# Patient Record
Sex: Male | Born: 1965 | Race: White | Hispanic: No | Marital: Married | Smoking: Never smoker
Health system: Southern US, Community
[De-identification: ages and names within clinical notes are randomized; demographics above are authoritative.]

## PROBLEM LIST (undated history)

## (undated) DIAGNOSIS — M109 Gout, unspecified: Secondary | ICD-10-CM

## (undated) DIAGNOSIS — I10 Essential (primary) hypertension: Secondary | ICD-10-CM

## (undated) DIAGNOSIS — E119 Type 2 diabetes mellitus without complications: Secondary | ICD-10-CM

## (undated) DIAGNOSIS — E785 Hyperlipidemia, unspecified: Secondary | ICD-10-CM

## (undated) HISTORY — PX: TONSILLECTOMY: SUR1361

## (undated) HISTORY — PX: APPENDECTOMY: SHX54

---

## 2007-01-15 ENCOUNTER — Emergency Department: Payer: Self-pay | Admitting: Emergency Medicine

## 2007-03-21 ENCOUNTER — Ambulatory Visit: Payer: Self-pay | Admitting: Urology

## 2009-11-12 ENCOUNTER — Ambulatory Visit: Payer: Self-pay | Admitting: General Practice

## 2011-08-19 ENCOUNTER — Ambulatory Visit: Payer: Self-pay | Admitting: General Practice

## 2011-08-28 ENCOUNTER — Ambulatory Visit: Payer: Self-pay | Admitting: General Practice

## 2011-09-21 ENCOUNTER — Ambulatory Visit: Payer: Self-pay | Admitting: General Practice

## 2011-11-09 ENCOUNTER — Emergency Department: Payer: Self-pay | Admitting: *Deleted

## 2011-11-09 LAB — COMPREHENSIVE METABOLIC PANEL
Albumin: 3.5 g/dL (ref 3.4–5.0)
Alkaline Phosphatase: 55 U/L (ref 50–136)
Anion Gap: 8 (ref 7–16)
BUN: 20 mg/dL — ABNORMAL HIGH (ref 7–18)
Bilirubin,Total: 0.3 mg/dL (ref 0.2–1.0)
Calcium, Total: 8.3 mg/dL — ABNORMAL LOW (ref 8.5–10.1)
Co2: 24 mmol/L (ref 21–32)
Creatinine: 1.32 mg/dL — ABNORMAL HIGH (ref 0.60–1.30)
EGFR (Non-African Amer.): >60
Potassium: 4.2 mmol/L (ref 3.5–5.1)
SGOT(AST): 26 U/L (ref 15–37)
Sodium: 139 mmol/L (ref 136–145)

## 2011-11-09 LAB — URINALYSIS, COMPLETE
Bacteria: NONE SEEN
Bilirubin,UR: NEGATIVE
Glucose,UR: NEGATIVE mg/dL (ref 0–75)
Ketone: NEGATIVE
Leukocyte Esterase: NEGATIVE
Nitrite: NEGATIVE
Ph: 5 (ref 4.5–8.0)
Squamous Epithelial: NONE SEEN

## 2011-11-09 LAB — CBC WITH DIFFERENTIAL/PLATELET
Basophil #: 0 10*3/uL (ref 0.0–0.1)
Eosinophil #: 0.1 10*3/uL (ref 0.0–0.7)
HGB: 14.9 g/dL (ref 13.0–18.0)
MCH: 29 pg (ref 26.0–34.0)
MCHC: 32.5 g/dL (ref 32.0–36.0)
MCV: 89 fL (ref 80–100)
Monocyte %: 7.8 %
Neutrophil #: 5 10*3/uL (ref 1.4–6.5)
Neutrophil %: 68.6 %
RDW: 14.3 % (ref 11.5–14.5)

## 2012-04-16 ENCOUNTER — Emergency Department: Payer: Self-pay | Admitting: Emergency Medicine

## 2012-04-16 LAB — CBC
MCH: 29.3 pg (ref 26.0–34.0)
MCHC: 32.9 g/dL (ref 32.0–36.0)
MCV: 89 fL (ref 80–100)
RBC: 5.34 10*6/uL (ref 4.40–5.90)
WBC: 11.9 10*3/uL — ABNORMAL HIGH (ref 3.8–10.6)

## 2012-04-16 LAB — URINALYSIS, COMPLETE
Bilirubin,UR: NEGATIVE
Ketone: NEGATIVE
Leukocyte Esterase: NEGATIVE
Ph: 5 (ref 4.5–8.0)
Protein: NEGATIVE
RBC,UR: 17 /HPF (ref 0–5)
Specific Gravity: 1.021 (ref 1.003–1.030)
Squamous Epithelial: NONE SEEN

## 2012-04-16 LAB — COMPREHENSIVE METABOLIC PANEL
Albumin: 3.8 g/dL (ref 3.4–5.0)
Alkaline Phosphatase: 63 U/L (ref 50–136)
BUN: 16 mg/dL (ref 7–18)
Bilirubin,Total: 0.3 mg/dL (ref 0.2–1.0)
Calcium, Total: 9 mg/dL (ref 8.5–10.1)
Creatinine: 1.31 mg/dL — ABNORMAL HIGH (ref 0.60–1.30)
EGFR (Non-African Amer.): >60
Glucose: 121 mg/dL — ABNORMAL HIGH (ref 65–99)
Potassium: 4.1 mmol/L (ref 3.5–5.1)
SGOT(AST): 29 U/L (ref 15–37)
SGPT (ALT): 56 U/L (ref 12–78)

## 2014-02-05 ENCOUNTER — Ambulatory Visit: Payer: Self-pay | Admitting: General Practice

## 2016-03-11 ENCOUNTER — Other Ambulatory Visit: Payer: Self-pay | Admitting: Physician Assistant

## 2016-03-13 ENCOUNTER — Other Ambulatory Visit: Payer: Self-pay | Admitting: Physician Assistant

## 2017-09-30 ENCOUNTER — Other Ambulatory Visit: Payer: Self-pay | Admitting: Family Medicine

## 2017-09-30 ENCOUNTER — Ambulatory Visit
Admission: RE | Admit: 2017-09-30 | Discharge: 2017-09-30 | Disposition: A | Discharge status: Home / Self Care | Payer: BLUE CROSS/BLUE SHIELD | Source: Ambulatory Visit | Attending: Family Medicine | Admitting: Family Medicine

## 2017-09-30 DIAGNOSIS — M79605 Pain in left leg: Secondary | ICD-10-CM | POA: Diagnosis present

## 2017-10-06 ENCOUNTER — Ambulatory Visit: Payer: BLUE CROSS/BLUE SHIELD | Attending: Otolaryngology

## 2017-10-06 DIAGNOSIS — F5101 Primary insomnia: Secondary | ICD-10-CM | POA: Insufficient documentation

## 2017-10-06 DIAGNOSIS — G4733 Obstructive sleep apnea (adult) (pediatric): Secondary | ICD-10-CM | POA: Insufficient documentation

## 2017-12-12 ENCOUNTER — Emergency Department
Admission: EM | Admit: 2017-12-12 | Discharge: 2017-12-12 | Disposition: A | Discharge status: Home / Self Care | Payer: BLUE CROSS/BLUE SHIELD | Attending: Emergency Medicine | Admitting: Emergency Medicine

## 2017-12-12 DIAGNOSIS — R1013 Epigastric pain: Secondary | ICD-10-CM | POA: Diagnosis present

## 2017-12-12 DIAGNOSIS — R112 Nausea with vomiting, unspecified: Secondary | ICD-10-CM | POA: Insufficient documentation

## 2017-12-12 DIAGNOSIS — I10 Essential (primary) hypertension: Secondary | ICD-10-CM | POA: Insufficient documentation

## 2017-12-12 DIAGNOSIS — E119 Type 2 diabetes mellitus without complications: Secondary | ICD-10-CM | POA: Diagnosis not present

## 2017-12-12 HISTORY — DX: Essential (primary) hypertension: I10

## 2017-12-12 HISTORY — DX: Gout, unspecified: M10.9

## 2017-12-12 HISTORY — DX: Hyperlipidemia, unspecified: E78.5

## 2017-12-12 HISTORY — DX: Type 2 diabetes mellitus without complications: E11.9

## 2017-12-12 LAB — URINALYSIS, COMPLETE (UACMP) WITH MICROSCOPIC
BILIRUBIN URINE: NEGATIVE
Bacteria, UA: NONE SEEN
GLUCOSE, UA: NEGATIVE mg/dL
HGB URINE DIPSTICK: NEGATIVE
Ketones, ur: NEGATIVE mg/dL
LEUKOCYTES UA: NEGATIVE
NITRITE: NEGATIVE
PH: 5 (ref 5.0–8.0)
Protein, ur: NEGATIVE mg/dL
SPECIFIC GRAVITY, URINE: 1.02 (ref 1.005–1.030)
Squamous Epithelial / LPF: NONE SEEN (ref 0–5)

## 2017-12-12 LAB — COMPREHENSIVE METABOLIC PANEL
ALBUMIN: 3.9 g/dL (ref 3.5–5.0)
ALT: 32 U/L (ref 17–63)
ANION GAP: 11 (ref 5–15)
AST: 30 U/L (ref 15–41)
Alkaline Phosphatase: 47 U/L (ref 38–126)
BILIRUBIN TOTAL: 0.8 mg/dL (ref 0.3–1.2)
BUN: 18 mg/dL (ref 6–20)
CO2: 21 mmol/L — AB (ref 22–32)
Calcium: 8.7 mg/dL — ABNORMAL LOW (ref 8.9–10.3)
Chloride: 105 mmol/L (ref 101–111)
Creatinine, Ser: 0.93 mg/dL (ref 0.61–1.24)
GFR calc Af Amer: >60 mL/min (ref >60)
GFR calc non Af Amer: >60 mL/min (ref >60)
GLUCOSE: 152 mg/dL — AB (ref 65–99)
POTASSIUM: 3.9 mmol/L (ref 3.5–5.1)
SODIUM: 137 mmol/L (ref 135–145)
TOTAL PROTEIN: 7.4 g/dL (ref 6.5–8.1)

## 2017-12-12 LAB — CBC
HCT: 47 % (ref 40.0–52.0)
HEMOGLOBIN: 16 g/dL (ref 13.0–18.0)
MCH: 30.1 pg (ref 26.0–34.0)
MCHC: 34.2 g/dL (ref 32.0–36.0)
MCV: 88.2 fL (ref 80.0–100.0)
Platelets: 198 10*3/uL (ref 150–440)
RBC: 5.32 MIL/uL (ref 4.40–5.90)
RDW: 14.1 % (ref 11.5–14.5)
WBC: 9.2 10*3/uL (ref 3.8–10.6)

## 2017-12-12 LAB — TROPONIN I

## 2017-12-12 LAB — LIPASE, BLOOD: Lipase: 26 U/L (ref 11–51)

## 2017-12-12 MED ORDER — ONDANSETRON 4 MG PO TBDP
ORAL_TABLET | ORAL | Status: AC
  Administered 2017-12-12: 4 mg via ORAL
  Filled 2017-12-12: qty 1

## 2017-12-12 MED ORDER — HYDROCODONE-ACETAMINOPHEN 5-325 MG PO TABS: 1.0000 | ORAL_TABLET | ORAL | 0 refills | Status: DC | PRN

## 2017-12-12 MED ORDER — HYDROCODONE-ACETAMINOPHEN 5-325 MG PO TABS
ORAL_TABLET | ORAL | Status: AC
  Administered 2017-12-12: 2 via ORAL
  Filled 2017-12-12: qty 2

## 2017-12-12 MED ORDER — HYDROCODONE-ACETAMINOPHEN 5-325 MG PO TABS
2.0000 | ORAL_TABLET | Freq: Once | ORAL | Status: AC
  Administered 2017-12-12: 2 via ORAL

## 2017-12-12 MED ORDER — ONDANSETRON 4 MG PO TBDP
4.0000 mg | ORAL_TABLET | Freq: Once | ORAL | Status: AC
  Administered 2017-12-12: 4 mg via ORAL

## 2017-12-12 MED ORDER — ONDANSETRON 4 MG PO TBDP: 4.0000 mg | ORAL_TABLET | Freq: Three times a day (TID) | ORAL | 0 refills | Status: DC | PRN

## 2017-12-12 NOTE — ED Triage Notes (Signed)
Pt reports having stressful night last night and then woke up this morning with epigastric pain and vomiting.  Pt is A&Ox4, in NAD.  Wife had stomach bug last Saturday.  Pt also had jaw pain.

## 2017-12-12 NOTE — ED Provider Notes (Addendum)
[facility] Emergency Department Provider Note  Time seen: 5:30 PM  I have reviewed the triage vital signs and the nursing notes.   HISTORY  Chief Complaint Abdominal Pain    HPI Ricardo Nelson is a 52 y.o. male with a past medical history of diabetes, hypertension, hyperlipidemia presents to the emergency department for mild upper abdominal discomfort and low-grade fever.  According to the patient since last night he has been experiencing some mild upper abdominal discomfort, subjective fever.  This morning had nausea and vomiting, denies any diarrhea.  States he was feeling some tightness into the chest and jaw this morning as well but states they had to put their dog down yesterday, he feels a lot of this is just anxiety and stress.  Wife states she had a GI bug earlier this week with fatigue nausea vomiting.   Past Medical History:  Diagnosis Date  . Diabetes mellitus without complication (HCC)   . Gout   . Hyperlipemia   . Hypertension     There are no active problems to display for this patient.   Past Surgical History:  Procedure Laterality Date  . APPENDECTOMY    . TONSILLECTOMY      Prior to Admission medications   Not on File    No Known Allergies  No family history on file.  Social History Social History   Tobacco Use  . Smoking status: Never Smoker  . Smokeless tobacco: Never Used  Substance Use Topics  . Alcohol use: Yes    Comment: 2 drinks a week  . Drug use: Not Currently    Review of Systems Constitutional: Low-grade fever ENT: Negative for recent illness/congestion Cardiovascular: Mild chest tightness Respiratory: Negative for shortness of breath.  Negative for cough. Gastrointestinal: Mild upper abdominal discomfort, positive for nausea vomiting.  Negative for diarrhea. Genitourinary: Negative for urinary compaints Skin: Negative for skin complaints  Neurological: Negative for headache All other ROS  negative  ____________________________________________   PHYSICAL EXAM:  VITAL SIGNS: ED Triage Vitals  Enc Vitals Group     BP 12/12/17 1605 (!) 158/93     Pulse Rate 12/12/17 1605 (!) 111     Resp 12/12/17 1605 (!) 22     Temp 12/12/17 1605 (!) 100.4 F (38 C)     Temp Source 12/12/17 1605 Oral     SpO2 12/12/17 1605 96 %     Weight 12/12/17 1606 (!) 320 lb (145.2 kg)     Height --      Head Circumference --      Peak Flow --      Pain Score 12/12/17 1605 6     Pain Loc --      Pain Edu? --      Excl. in GC? --    Constitutional: Alert and oriented. Well appearing and in no distress. Eyes: Normal exam ENT   Head: Normocephalic and atraumatic.   Mouth/Throat: Mucous membranes are moist. Cardiovascular: Regular rhythm, rate around 100 bpm.  No murmur. Respiratory: Normal respiratory effort without tachypnea nor retractions. Breath sounds are clear, no wheeze rales or rhonchi. Gastrointestinal: Obese, but soft very slight epigastric tenderness otherwise benign abdomen.  No rebound or guarding.  Deep palpation across the lower abdomen and right upper quadrant with no tenderness elicited. Musculoskeletal: Nontender with normal range of motion in all extremities.  Neurologic:  Normal speech and language. No gross focal neurologic deficits Skin:  Skin is warm, dry and intact.  Psychiatric: Mood  and affect are normal.   ____________________________________________  EKG reviewed and interpreted by myself shows sinus tachycardia 111 bpm, narrow QRS, normal axis, normal intervals, no concerning ST changes.  INITIAL IMPRESSION / ASSESSMENT AND PLAN / ED COURSE  Pertinent labs & imaging results that were available during my care of the patient were reviewed by me and considered in my medical decision making (see chart for details).  Patient presents to the emergency department for upper abdominal discomfort nausea vomiting.  Patient believes it is just a GI bug.   Differential would include gastroenteritis, gastritis, gastric or peptic ulcers, pancreatitis, cholecystitis.  Reassuringly patient has very slight epigastric tenderness otherwise a completely benign abdominal exam.  Patient's white blood cell count is normal, lipase is normal, LFTs and troponin are normal.  EKG is reassuring.  Patient does have a low-grade temperature 100.4 in the emergency department and mild tachycardia upon arrival although currently around 100 bpm during my auscultation.  Wife had GI illness earlier this week, this could very likely be gastroenteritis however given the patient's low-grade temperature and epigastric discomfort I discussed with the patient proceeding with CT imaging at this time to further evaluate.  Patient would prefer holding off on CT imaging.  I discussed another reasonable approach would be a trial of home medications for pain and nausea with follow-up if his symptoms do not improve or worsen at any point.  Patient would prefer the second option.  Given a normal white blood cell count and a fairly benign abdominal exam I believe this is a reasonable plan of care.  EKG reviewed and interpreted by myself shows sinus tachycardia at 111 bpm, narrow QRS, mild left axis deviation, normal intervals with no concerning ST changes.  ____________________________________________   FINAL CLINICAL IMPRESSION(S) / ED DIAGNOSES  Abdominal pain Nausea vomiting    Minna AntisPaduchowski, Sion Thane, MD 12/12/17 1737    Minna AntisPaduchowski, Ellory Khurana, MD 12/21/17 1134

## 2017-12-12 NOTE — ED Notes (Signed)
AAOx3.  Skin warm and dry.  NAD 

## 2017-12-12 NOTE — Discharge Instructions (Addendum)
Please take your medications as prescribed, as needed.  Please obtain plenty of rest.  As we discussed if your pain worsens again vomiting unable to keep down fluids or medications please return to the emergency department immediately for further evaluation.  Otherwise please follow-up with your doctor tomorrow for recheck/reevaluation.

## 2017-12-12 NOTE — ED Notes (Signed)
First Nurse Note: Pt to ED c/o abdominal pain and vomiting since this morning.

## 2018-09-12 ENCOUNTER — Encounter: Payer: Self-pay | Admitting: Internal Medicine

## 2018-09-20 ENCOUNTER — Encounter: Payer: Self-pay | Admitting: *Deleted

## 2019-03-23 ENCOUNTER — Ambulatory Visit: Payer: Self-pay

## 2019-03-23 DIAGNOSIS — Z23 Encounter for immunization: Secondary | ICD-10-CM

## 2019-04-11 ENCOUNTER — Ambulatory Visit: Payer: 59 | Admitting: Physician Assistant

## 2019-04-11 ENCOUNTER — Encounter: Payer: Self-pay | Admitting: Physician Assistant

## 2019-04-11 ENCOUNTER — Other Ambulatory Visit: Payer: Self-pay

## 2019-04-11 VITALS — BP 122/79 | HR 77 | Temp 98.4°F | Ht 71.0 in | Wt 316.0 lb

## 2019-04-11 DIAGNOSIS — E119 Type 2 diabetes mellitus without complications: Secondary | ICD-10-CM

## 2019-04-11 DIAGNOSIS — R109 Unspecified abdominal pain: Secondary | ICD-10-CM

## 2019-04-11 LAB — POCT GLYCOSYLATED HEMOGLOBIN (HGB A1C): Hemoglobin A1C: 9 % — AB (ref 4.0–5.6)

## 2019-04-11 LAB — POCT URINALYSIS DIPSTICK
Bilirubin, UA: NEGATIVE
Blood, UA: NEGATIVE
Glucose, UA: NEGATIVE
Ketones, UA: NEGATIVE
Leukocytes, UA: NEGATIVE
Nitrite, UA: NEGATIVE
Protein, UA: NEGATIVE
Spec Grav, UA: >=1.03 — AB (ref 1.010–1.025)
Urobilinogen, UA: 0.2 E.U./dL
pH, UA: 5 (ref 5.0–8.0)

## 2019-04-11 MED ORDER — CYCLOBENZAPRINE HCL 10 MG PO TABS: 10.0000 mg | ORAL_TABLET | Freq: Three times a day (TID) | ORAL | 0 refills | Status: DC | PRN

## 2019-04-11 NOTE — Progress Notes (Signed)
   Subjective:Flank Pain    Patient ID: Ricardo Nelson, male    DOB: 12-22-1965, 53 y.o.   MRN: [id number]  HPI Patient c/o 2 week of right flank pain. Denies dysuria or hematuria. Denies abdominal pain. Patient HgA1c was elevated at 9.0 today. Patient states non-compliance with Metformin.    Review of Systems Right flank pain    Objective:   Physical Exam Morbid obesity. No spinal deformity. No CVA guarding. Moderate right paraspinal muscle spasms with left lateral movement.       Assessment & Plan:  Right flank pain/strain. Non-compliance Type II diabetes. Take Flexeril as direced. Re-start Metformin and follow up 3 months.

## 2019-04-11 NOTE — Progress Notes (Signed)
Right flank pain 1-2 weeks  No OTC meds used.  States been drinking a lot of water Denies pain with urination.  States comes out forcefully.  Hasn't seen any blood. No fever.  No N/V.  No known injury. Occ has felt some discomfort in testicles. No pelvic/pain pressure.  Sharp at times.  Pain is intermittent.  Comes on with certain body movement.  AMD.

## 2019-05-29 ENCOUNTER — Other Ambulatory Visit: Payer: Self-pay

## 2019-05-29 DIAGNOSIS — Z20822 Contact with and (suspected) exposure to covid-19: Secondary | ICD-10-CM

## 2019-05-31 LAB — NOVEL CORONAVIRUS, NAA: SARS-CoV-2, NAA: NOT DETECTED

## 2019-07-04 ENCOUNTER — Ambulatory Visit: Payer: 59

## 2019-07-11 ENCOUNTER — Ambulatory Visit: Payer: Self-pay

## 2019-07-11 ENCOUNTER — Other Ambulatory Visit: Payer: Self-pay

## 2019-07-11 DIAGNOSIS — Z0000 Encounter for general adult medical examination without abnormal findings: Secondary | ICD-10-CM

## 2019-07-11 LAB — POCT URINALYSIS DIPSTICK
Bilirubin, UA: NEGATIVE
Blood, UA: NEGATIVE
Glucose, UA: NEGATIVE
Ketones, UA: NEGATIVE
Leukocytes, UA: NEGATIVE
Nitrite, UA: NEGATIVE
Protein, UA: POSITIVE — AB
Spec Grav, UA: >=1.03 — AB (ref 1.010–1.025)
Urobilinogen, UA: 0.2 E.U./dL
pH, UA: 5 (ref 5.0–8.0)

## 2019-07-11 NOTE — Progress Notes (Signed)
Patient is here today to complete pre physical labs and EKG. He is scheduled for a physical with Anette Riedel, PA-C on 07/13/19.

## 2019-07-12 LAB — CMP12+LP+TP+TSH+6AC+PSA+CBC…
ALT: 52 IU/L — ABNORMAL HIGH (ref 0–44)
AST: 34 IU/L (ref 0–40)
Albumin/Globulin Ratio: 1.5 (ref 1.2–2.2)
Albumin: 4.3 g/dL (ref 3.8–4.9)
Alkaline Phosphatase: 56 IU/L (ref 39–117)
BUN/Creatinine Ratio: 13 (ref 9–20)
BUN: 14 mg/dL (ref 6–24)
Basophils Absolute: 0 10*3/uL (ref 0.0–0.2)
Basos: 0 %
Chloride: 99 mmol/L (ref 96–106)
Chol/HDL Ratio: 4.1 ratio (ref 0.0–5.0)
Cholesterol, Total: 179 mg/dL (ref 100–199)
Creatinine, Ser: 1.06 mg/dL (ref 0.76–1.27)
Eos: 3 %
Estimated CHD Risk: 0.8 times avg. (ref 0.0–1.0)
Free Thyroxine Index: 1.5 (ref 1.2–4.9)
GFR calc Af Amer: 91 mL/min/{1.73_m2} (ref >59)
GFR calc non Af Amer: 79 mL/min/{1.73_m2} (ref >59)
GGT: 39 IU/L (ref 0–65)
Hemoglobin: 16.8 g/dL (ref 13.0–17.7)
Immature Grans (Abs): 0.1 10*3/uL (ref 0.0–0.1)
Iron: 79 ug/dL (ref 38–169)
LDH: 186 IU/L (ref 121–224)
LDL Chol Calc (NIH): 97 mg/dL (ref 0–99)
Lymphs: 28 %
MCHC: 34.4 g/dL (ref 31.5–35.7)
MCV: 87 fL (ref 79–97)
Monocytes Absolute: 0.8 10*3/uL (ref 0.1–0.9)
Monocytes: 11 %
Platelets: 232 10*3/uL (ref 150–450)
Prostate Specific Ag, Serum: 0.8 ng/mL (ref 0.0–4.0)
RBC: 5.61 x10E6/uL (ref 4.14–5.80)
Total Protein: 7.1 g/dL (ref 6.0–8.5)
Triglycerides: 221 mg/dL — ABNORMAL HIGH (ref 0–149)
Uric Acid: 7.2 mg/dL (ref 3.8–8.4)
VLDL Cholesterol Cal: 38 mg/dL (ref 5–40)
WBC: 7 10*3/uL (ref 3.4–10.8)

## 2019-07-12 LAB — CMP12+LP+TP+TSH+6AC+PSA+CBC?
Bilirubin Total: 0.5 mg/dL (ref 0.0–1.2)
Calcium: 9.6 mg/dL (ref 8.7–10.2)
EOS (ABSOLUTE): 0.2 10*3/uL (ref 0.0–0.4)
Globulin, Total: 2.8 g/dL (ref 1.5–4.5)
Glucose: 227 mg/dL — ABNORMAL HIGH (ref 65–99)
HDL: 44 mg/dL (ref >39)
Hematocrit: 48.9 % (ref 37.5–51.0)
Immature Granulocytes: 1 %
Lymphocytes Absolute: 2 10*3/uL (ref 0.7–3.1)
MCH: 29.9 pg (ref 26.6–33.0)
Neutrophils Absolute: 3.9 10*3/uL (ref 1.4–7.0)
Neutrophils: 57 %
Phosphorus: 3.7 mg/dL (ref 2.8–4.1)
Potassium: 4.2 mmol/L (ref 3.5–5.2)
RDW: 13.1 % (ref 11.6–15.4)
Sodium: 139 mmol/L (ref 134–144)
T3 Uptake Ratio: 26 % (ref 24–39)
T4, Total: 5.7 ug/dL (ref 4.5–12.0)
TSH: 1.18 u[IU]/mL (ref 0.450–4.500)

## 2019-07-12 LAB — MICROALBUMIN / CREATININE URINE RATIO
Creatinine, Urine: 218.9 mg/dL
Microalb/Creat Ratio: 31 mg/g creat — ABNORMAL HIGH (ref 0–29)
Microalbumin, Urine: 67 ug/mL

## 2019-07-12 LAB — HGB A1C W/O EAG: Hgb A1c MFr Bld: 9.4 % — ABNORMAL HIGH (ref 4.8–5.6)

## 2019-07-13 ENCOUNTER — Ambulatory Visit: Payer: 59 | Admitting: Physician Assistant

## 2019-07-13 ENCOUNTER — Other Ambulatory Visit: Payer: Self-pay

## 2019-07-13 ENCOUNTER — Encounter: Payer: Self-pay | Admitting: Physician Assistant

## 2019-07-13 VITALS — BP 120/76 | HR 110 | Temp 98.5°F | Resp 16 | Ht 71.0 in | Wt 315.0 lb

## 2019-07-13 DIAGNOSIS — E119 Type 2 diabetes mellitus without complications: Secondary | ICD-10-CM

## 2019-07-13 MED ORDER — FREESTYLE LANCETS MISC: 3 refills | Status: AC

## 2019-07-13 MED ORDER — BLOOD GLUCOSE MONITORING SUPPL W/DEVICE KIT: PACK | 0 refills | Status: AC

## 2019-07-13 MED ORDER — GLUCOSE BLOOD VI STRP: ORAL_STRIP | 3 refills | Status: AC

## 2019-07-13 NOTE — Addendum Note (Signed)
Addended by: Gardner Candle on: 07/13/2019 02:37 PM   Modules accepted: Orders

## 2019-07-13 NOTE — Progress Notes (Addendum)
Subjective:    Patient ID: Ricardo Nelson, male    DOB: Jun 06, 1966, 54 y.o.   MRN: [id number]  HPI  54 yo M for annual exam- Works at [employer]- has ability to walk daily Morbidly obese  recently lost a few pounds; DM2  A1C now 9.4 Takes metformin- but admits to irregular dosing- Not sure what strength , Not sure what correct orders are  Expresses desire to "get a handle on it" and make some changes Reports breakfast as [place] chicken biscuit with egg and cheese, large sweet tea Dinner last night was 3 servings of country fried steak and gravy over large servings of rice  No exercise Likes to eat out Has been drinking pickle juice for muscle cramps  Hasnt used CPAP in "a long time" Hasn't checked sugars in "a long time"- no idea where little machine is  07/11/19 labs FBS 227 Triglycerides 221 ALT 52  Urine protein positive   Review of Systems Intermittent back pain with rotational movement - description C/W muscle stress; reflect weight and deconditioning- no numbness or tingling  EKG WNL tachycardia    Objective:   Physical Exam Constitutional:      General: He is not in acute distress.    Appearance: Normal appearance. He is obese.     Comments: Morbidly obese 315 at 5 '11"  HENT:     Head: Normocephalic and atraumatic.     Right Ear: Tympanic membrane and ear canal normal.     Left Ear: Tympanic membrane and ear canal normal.     Nose: Nose normal.     Mouth/Throat:     Mouth: Mucous membranes are moist.  Eyes:     Extraocular Movements: Extraocular movements intact.     Pupils: Pupils are equal, round, and reactive to light.  Neck:     Comments: No glandular enlargement noted Cardiovascular:     Rate and Rhythm: Normal rate and regular rhythm.     Pulses: Normal pulses.  Pulmonary:     Effort: Pulmonary effort is normal.     Breath sounds: Normal breath sounds.  Abdominal:     Palpations: There is no mass.     Tenderness: There is no abdominal  tenderness.     Comments: Rotund, large panniculus, erythema skin, no mass identified- exam limited by physique  Genitourinary:    Comments: Deferred; concerns denied Musculoskeletal:        General: Normal range of motion.     Cervical back: Normal range of motion and neck supple. No rigidity.  Lymphadenopathy:     Cervical: No cervical adenopathy.  Skin:    General: Skin is warm and dry.     Capillary Refill: Capillary refill takes less than 2 seconds.  Neurological:     General: No focal deficit present.     Mental Status: He is alert.  Psychiatric:        Mood and Affect: Mood normal.        Behavior: Behavior normal.           Assessment & Plan:  Identify Rx taking  Dosage and schedule- call office w info Get out DM2 information and review with girlfriend- Incorporate her in dietary changes (she is household cook) Increase fruits and vegetables  Decrease protein servings Encourage participation in daily walk-commit to it ! Return to FBS each morning- record Calorie list from any fast food--get it and read it-avoid it  "Push back/no seconds/no white is right" 30 minutes  walk per day -- 03-31-09- to start is OK Add more any day you can  Find CPAP machine - run cleaning process- take to supply shop for review Posture - ears/shoulders/hips /ankles  Call back for repeat U/A 1 week RTC 1 month - weight, A1C, review FBS records, exercise record;discuss CPAP status  Repeat U/A Patient to schedule Dental care visit, establish q 6 mos  Addendum: 07/18/19  Marylyn Ishihara returned to clinic to repeat urine...protein clear..ketones present related to recent dieting-discussed moderation.  LWLpac

## 2019-07-13 NOTE — Patient Instructions (Signed)
Ricardo Nelson- Please return to the office one morning next week to repeat your urine test- Thh blood work that corresponds with it looks good but there is some protein in your urine I want to re check. If you havent had anything to eat or drink when you come in the nurses can also recheck your glucose while you are here. Please be sure to take a calendar page and record your exercise each day, your fasting blood sugar number each morning, how many hours you had your CPAP on each night.Marland Kitchen and anything you notice or want to talk about next visit. You can get on our scales while you are here if you wish.  Was good to see you. I am excited that you are ready to use 2021 to take better care of yourself !      CPAP and BPAP Information CPAP and BPAP are methods of helping a person breathe with the use of air pressure. CPAP stands for "continuous positive airway pressure." BPAP stands for "bi-level positive airway pressure." In both methods, air is blown through your nose or mouth and into your air passages to help you breathe well. CPAP and BPAP use different amounts of pressure to blow air. With CPAP, the amount of pressure stays the same while you breathe in and out. With BPAP, the amount of pressure is increased when you breathe in (inhale) so that you can take larger breaths. Your health care provider will recommend whether CPAP or BPAP would be more helpful for you. Why are CPAP and BPAP treatments used? CPAP or BPAP can be helpful if you have:  Sleep apnea.  Chronic obstructive pulmonary disease (COPD).  Heart failure.  Medical conditions that weaken the muscles of the chest including muscular dystrophy, or neurological diseases such as amyotrophic lateral sclerosis (ALS).  Other problems that cause breathing to be weak, abnormal, or difficult. CPAP is most commonly used for obstructive sleep apnea (OSA) to keep the airways from collapsing when the muscles relax during sleep. How is CPAP or  BPAP administered? Both CPAP and BPAP are provided by a small machine with a flexible plastic tube that attaches to a plastic mask. You wear the mask. Air is blown through the mask into your nose or mouth. The amount of pressure that is used to blow the air can be adjusted on the machine. Your health care provider will determine the pressure setting that should be used based on your individual needs. When should CPAP or BPAP be used? In most cases, the mask only needs to be worn during sleep. Generally, the mask needs to be worn throughout the night and during any daytime naps. People with certain medical conditions may also need to wear the mask at other times when they are awake. Follow instructions from your health care provider about when to use the machine. What are some tips for using the mask?   Because the mask needs to be snug, some people feel trapped or closed-in (claustrophobic) when first using the mask. If you feel this way, you may need to get used to the mask. One way to do this is by holding the mask loosely over your nose or mouth and then gradually applying the mask more snugly. You can also gradually increase the amount of time that you use the mask.  Masks are available in various types and sizes. Some fit over your mouth and nose while others fit over just your nose. If your mask does not fit  well, talk with your health care provider about getting a different one.  If you are using a mask that fits over your nose and you tend to breathe through your mouth, a chin strap may be applied to help keep your mouth closed.  The CPAP and BPAP machines have alarms that may sound if the mask comes off or develops a leak.  If you have trouble with the mask, it is very important that you talk with your health care provider about finding a way to make the mask easier to tolerate. Do not stop using the mask. Stopping the use of the mask could have a negative impact on your health. What are  some tips for using the machine?  Place your CPAP or BPAP machine on a secure table or stand near an electrical outlet.  Know where the on/off switch is located on the machine.  Follow instructions from your health care provider about how to set the pressure on your machine and when you should use it.  Do not eat or drink while the CPAP or BPAP machine is on. Food or fluids could get pushed into your lungs by the pressure of the CPAP or BPAP.  Do not smoke. Tobacco smoke residue can damage the machine.  For home use, CPAP and BPAP machines can be rented or purchased through home health care companies. Many different brands of machines are available. Renting a machine before purchasing may help you find out which particular machine works well for you.  Keep the CPAP or BPAP machine and attachments clean. Ask your health care provider for specific instructions. Get help right away if:  You have redness or open areas around your nose or mouth where the mask fits.  You have trouble using the CPAP or BPAP machine.  You cannot tolerate wearing the CPAP or BPAP mask.  You have pain, discomfort, and bloating in your abdomen. Summary  CPAP and BPAP are methods of helping a person breathe with the use of air pressure.  Both CPAP and BPAP are provided by a small machine with a flexible plastic tube that attaches to a plastic mask.  If you have trouble with the mask, it is very important that you talk with your health care provider about finding a way to make the mask easier to tolerate. This information is not intended to replace advice given to you by your health care provider. Make sure you discuss any questions you have with your health care provider. Document Revised: 09/28/2018 Document Reviewed: 04/27/2016 Elsevier Patient Education  [place]. Blood Glucose Monitoring, Adult Monitoring your blood sugar (glucose) is an important part of managing your diabetes (diabetes  mellitus). Blood glucose monitoring involves checking your blood glucose as often as directed and keeping a record (log) of your results over time. Checking your blood glucose regularly and keeping a blood glucose log can:  Help you and your health care provider adjust your diabetes management plan as needed, including your medicines or insulin.  Help you understand how food, exercise, illnesses, and medicines affect your blood glucose.  Let you know what your blood glucose is at any time. You can quickly find out if you have low blood glucose (hypoglycemia) or high blood glucose (hyperglycemia). Your health care provider will set individualized treatment goals for you. Your goals will be based on your age, other medical conditions you have, and how you respond to diabetes treatment. Generally, the goal of treatment is to maintain the following blood  glucose levels:  Before meals (preprandial): 80-130 mg/dL (4.4-7.2 mmol/L).  After meals (postprandial): below 180 mg/dL (10 mmol/L).  A1c level: less than 7%. Supplies needed:  Blood glucose meter.  Test strips for your meter. Each meter has its own strips. You must use the strips that came with your meter.  A needle to prick your finger (lancet). Do not use a lancet more than one time.  A device that holds the lancet (lancing device).  A journal or log book to write down your results. How to check your blood glucose  1. Wash your hands with soap and water. 2. Prick the side of your finger (not the tip) with the lancet. Use a different finger each time. 3. Gently rub the finger until a small drop of blood appears. 4. Follow instructions that come with your meter for inserting the test strip, applying blood to the strip, and using your blood glucose meter. 5. Write down your result and any notes. Some meters allow you to use areas of your body other than your finger (alternative sites) to test your blood. The most common alternative  sites are:  Forearm.  Thigh.  Palm of the hand. If you think you may have hypoglycemia, or if you have a history of not knowing when your blood glucose is getting low (hypoglycemia unawareness), do not use alternative sites. Use your finger instead. Alternative sites may not be as accurate as the fingers, because blood flow is slower in these areas. This means that the result you get may be delayed, and it may be different from the result that you would get from your finger. Follow these instructions at home: Blood glucose log   Every time you check your blood glucose, write down your result. Also write down any notes about things that may be affecting your blood glucose, such as your diet and exercise for the day. This information can help you and your health care provider: ? Look for patterns in your blood glucose over time. ? Adjust your diabetes management plan as needed.  Check if your meter allows you to download your records to a computer. Most glucose meters store a record of glucose readings in the meter. If you have type 1 diabetes:  Check your blood glucose 2 or more times a day.  Also check your blood glucose: ? Before every insulin injection. ? Before and after exercise. ? Before meals. ? 2 hours after a meal. ? Occasionally between 2:00 a.m. and 3:00 a.m., as directed. ? Before potentially dangerous tasks, like driving or using heavy machinery. ? At bedtime.  You may need to check your blood glucose more often, up to 6-10 times a day, if you: ? Use an insulin pump. ? Need multiple daily injections (MDI). ? Have diabetes that is not well-controlled. ? Are ill. ? Have a history of severe hypoglycemia. ? Have hypoglycemia unawareness. If you have type 2 diabetes:  If you take insulin or other diabetes medicines, check your blood glucose 2 or more times a day.  If you are on intensive insulin therapy, check your blood glucose 4 or more times a day. Occasionally, you  may also need to check between 2:00 a.m. and 3:00 a.m., as directed.  Also check your blood glucose: ? Before and after exercise. ? Before potentially dangerous tasks, like driving or using heavy machinery.  You may need to check your blood glucose more often if: ? Your medicine is being adjusted. ? Your diabetes is not  well-controlled. ? You are ill. General tips  Always keep your supplies with you.  If you have questions or need help, all blood glucose meters have a 24-hour "hotline" phone number that you can call. You may also contact your health care provider.  After you use a few boxes of test strips, adjust (calibrate) your blood glucose meter by following instructions that came with your meter. Contact a health care provider if:  Your blood glucose is at or above 240 mg/dL (13.3 mmol/L) for 2 days in a row.  You have been sick or have had a fever for 2 days or longer, and you are not getting better.  You have any of the following problems for more than 6 hours: ? You cannot eat or drink. ? You have nausea or vomiting. ? You have diarrhea. Get help right away if:  Your blood glucose is lower than 54 mg/dL (3 mmol/L).  You become confused or you have trouble thinking clearly.  You have difficulty breathing.  You have moderate or large ketone levels in your urine. Summary  Monitoring your blood sugar (glucose) is an important part of managing your diabetes (diabetes mellitus).  Blood glucose monitoring involves checking your blood glucose as often as directed and keeping a record (log) of your results over time.  Your health care provider will set individualized treatment goals for you. Your goals will be based on your age, other medical conditions you have, and how you respond to diabetes treatment.  Every time you check your blood glucose, write down your result. Also write down any notes about things that may be affecting your blood glucose, such as your diet and  exercise for the day. This information is not intended to replace advice given to you by your health care provider. Make sure you discuss any questions you have with your health care provider. Document Revised: 04/01/2018 Document Reviewed: 11/18/2015 Elsevier Patient Education  [place].  Proteinuria Proteinuria is when there is too much protein in the urine. Proteins are important for building muscles and bones. Proteins are also needed to fight infections, help the blood to clot, and keep body fluids in balance. Proteinuria may be mild and temporary, or it may be an early sign of kidney disease. The kidneys make urine. Healthy kidneys also keep substances like proteins from leaving the blood and ending up in the urine. What are the causes? This condition may be caused by damage to the kidneys or by temporary causes such as fever or stress. Proteinuria may happen when the kidneys are not working well. Healthy kidneys have filters (glomeruli) that keep proteins out of the urine. Proteinuria may mean that the glomeruli are damaged. The main causes of this type of damage are:  Diabetes.  High blood pressure. Other causes of kidney damage can also cause proteinuria, such as:  Diseases of the immune system, such as lupus, rheumatoid arthritis, sarcoidosis, and Goodpasture syndrome.  Heart disease or heart failure.  Kidney infection.  Certain cancers, including kidney cancer, lymphoma, leukemia, and multiple myeloma.  Amyloidosis. This is a disease that causes abnormal proteins to build up in body tissues.  Reactions to certain medicines, such as NSAIDs.  Injuries or poisons (toxins).  High blood pressure that occurs during pregnancy (preeclampsia and eclampsia). Temporary proteinuria may result from conditions that put stress on the kidneys. These conditions usually do not cause kidney damage. They include:  Fever.  Exposure to cold or heat.  Emotional or physical  stress.  Extreme exercise.  Standing for long periods of time. What increases the risk? You are more likely to develop this condition if you:  Have diabetes.  Have high blood pressure.  Have heart disease or heart failure.  Have an immune disease, cancer, or other disease that affects the kidneys.  Have a family history of kidney disease.  Are 26 years of age or older.  Are overweight.  Are of African American, American [place], Hispanic/Latino, or [place] descent.  Are pregnant.  Have an infection. What are the signs or symptoms? Mild proteinuria may not cause symptoms. As more proteins enter the urine, symptoms of kidney disease may develop, such as:  Foamy urine.  Swelling of the face, abdomen, hands, legs, or feet (edema).  Needing to urinate frequently.  Fatigue.  Difficulty sleeping.  Dry and itchy skin.  Nausea and vomiting.  Muscle cramps.  Shortness of breath. How is this diagnosed? This condition may be diagnosed with a urine test. You may have this test as part of a routine physical exam or because you have symptoms of kidney disease or risk factors for kidney disease. You may also have:  Blood tests to measure the level of a certain substance (creatinine) that increases with kidney disease.  Imaging tests of your kidney, such as a CT scan or an ultrasound, to look for signs of kidney damage. How is this treated? If your proteinuria is mild or temporary, treatment may not be needed for this condition. Your health care provider may show you how to monitor the level of protein in your urine at home. Identifying proteinuria early is important so that the cause of the condition can be treated. Treatment for this condition depends on the cause of your proteinuria. Treatment may include:  Making diet and lifestyle changes.  Getting blood pressure under control.  Getting blood sugar under control, if you have diabetes.  Managing any other  medical conditions you have that affect your kidneys.  Giving birth, if you are pregnant.  Avoiding medicines that damage your kidneys. In severe cases, kidney disease may need to be treated with medicines or dialysis. Follow these instructions at home: Activity  Return to your normal activities as told by your health care provider. Ask your health care provider what activities are safe for you.  Ask your health care provider to recommend an exercise program. General instructions  Check your protein levels at home if directed by your health care provider.  Follow instructions from your health care provider about eating or drinking restrictions.  If you are overweight, ask your health care provider about diets that can help you get to a healthy weight.  Take over-the-counter and prescription medicines only as told by your health care provider.  Keep all follow-up visits as told by your health care provider. This is important. Contact a health care provider if:  You have new symptoms.  Your symptoms get worse or do not improve. Get help right away if you:  Have back pain.  Have diarrhea.  Vomit.  Have a fever.  Have a rash. Summary  Proteinuria is when there is too much protein in the urine.  Proteinuria may be mild and temporary, or it may be an early sign of kidney disease.  This condition may be diagnosed with a urine test.  Treatment for this condition depends on the cause of your proteinuria.  Treatment may include diet and lifestyle changes, blood pressure and blood sugar management, and avoiding medicines that may  damage the kidneys. If the proteinuria is severe, it may need to be treated with medicines or dialysis. This information is not intended to replace advice given to you by your health care provider. Make sure you discuss any questions you have with your health care provider. Document Revised: 01/24/2018 Document Reviewed: 01/24/2018 Elsevier Patient  Education  [place].

## 2019-07-18 ENCOUNTER — Other Ambulatory Visit: Payer: Self-pay

## 2019-07-18 ENCOUNTER — Ambulatory Visit: Payer: Self-pay

## 2019-07-18 ENCOUNTER — Other Ambulatory Visit: Payer: Self-pay | Admitting: Physician Assistant

## 2019-07-18 DIAGNOSIS — R809 Proteinuria, unspecified: Secondary | ICD-10-CM

## 2019-07-18 LAB — POCT URINALYSIS DIPSTICK
Bilirubin, UA: NEGATIVE
Blood, UA: NEGATIVE
Glucose, UA: NEGATIVE
Leukocytes, UA: NEGATIVE
Nitrite, UA: NEGATIVE
Protein, UA: NEGATIVE
Spec Grav, UA: 1.025 (ref 1.010–1.025)
Urobilinogen, UA: 0.2 E.U./dL
pH, UA: 5.5 (ref 5.0–8.0)

## 2019-07-18 MED ORDER — INDOMETHACIN 50 MG PO CAPS
50.0000 mg | ORAL_CAPSULE | Freq: Two times a day (BID) | ORAL | 1 refills | Status: DC | PRN
Start: 2019-07-18 — End: 2021-07-02

## 2019-07-18 NOTE — Progress Notes (Signed)
Follow-up urinalysis as requested by Anette Riedel, PA-C (Interim Provider). Protein in urine specimen collected for physical.  Wt = 209 lbs - Down 6 lbs from physical  States he's walking & watching his diet - cutting out simple carbs, soft drinks.   AMD

## 2019-07-18 NOTE — Progress Notes (Signed)
Ricardo Nelson is actively trying to do exercise and walking program- requests refil on previous Rx Indomethacin Will try 50 mg   Po BID as needed   ( 25 mg n/a)  Use lowest effective does, shortest effective duration with food whenever possible  RTC as previously directed     U/A was repeated this morning and protein had cleared.

## 2019-08-03 ENCOUNTER — Ambulatory Visit: Payer: 59 | Attending: Internal Medicine

## 2019-08-03 DIAGNOSIS — Z20822 Contact with and (suspected) exposure to covid-19: Secondary | ICD-10-CM | POA: Insufficient documentation

## 2019-08-04 LAB — NOVEL CORONAVIRUS, NAA: SARS-CoV-2, NAA: NOT DETECTED

## 2019-08-17 ENCOUNTER — Ambulatory Visit: Payer: Self-pay | Admitting: Physician Assistant

## 2019-08-17 ENCOUNTER — Other Ambulatory Visit: Payer: Self-pay

## 2019-08-17 VITALS — BP 122/75 | HR 90 | Temp 97.8°F | Ht 71.0 in | Wt 301.8 lb

## 2019-08-17 DIAGNOSIS — Z7689 Persons encountering health services in other specified circumstances: Secondary | ICD-10-CM

## 2019-08-17 DIAGNOSIS — E119 Type 2 diabetes mellitus without complications: Secondary | ICD-10-CM

## 2019-08-17 NOTE — Progress Notes (Signed)
   Subjective:    Patient ID: Ricardo Nelson, male    DOB: Jan 01, 1966, 54 y.o.   MRN: [id number]  HPI 1 month f/u  For weight and BP  Reports walking daily- Stopped alcohol and sodas- drinking much more water Stopped junk food  Stopped "eating it just because it is there". Leftovers, seconds etc  He and girlfriend are both concentrating of fresh vegetable and salads as main portion of meals, meat portion as addition  And he is SO pleased to report improvement... Lost 14 pounds and BP  122/75 Feels much better Clothes already feel different   Review of Systems  All other systems reviewed and are negative.  Admits he did not do FBS checks as requested    Objective:   Physical Exam Vitals and nursing note reviewed.   Up beat , happy and proud, pleased  May be experiencing some early seasonal allergies- locally noted as early this year..    Assessment & Plan:  Congratulated and encouraged to continue Never give up- each morning can be a new start if he sees a slip  RTC 1 mo for continuing support Start OTC seasonal allergy intervention as needed  Plan DM management 3 mos F/U

## 2019-08-17 NOTE — Patient Instructions (Signed)

## 2019-12-04 ENCOUNTER — Other Ambulatory Visit: Payer: Self-pay

## 2019-12-04 MED ORDER — LISINOPRIL-HYDROCHLOROTHIAZIDE 20-12.5 MG PO TABS: 2.0000 | ORAL_TABLET | Freq: Every day | ORAL | 2 refills | Status: DC

## 2020-03-14 ENCOUNTER — Other Ambulatory Visit: Payer: Self-pay | Admitting: Internal Medicine

## 2020-03-14 DIAGNOSIS — E7800 Pure hypercholesterolemia, unspecified: Secondary | ICD-10-CM

## 2020-03-22 ENCOUNTER — Other Ambulatory Visit: Payer: Self-pay

## 2020-03-22 DIAGNOSIS — Z1152 Encounter for screening for COVID-19: Secondary | ICD-10-CM

## 2020-03-22 NOTE — Progress Notes (Signed)
Pt presented today exposed to covid and minor symptoms. Runny nose and sore throat for a couple of weeks. Pt has had both vaccines. CL,RMA

## 2020-03-24 LAB — SARS-COV-2, NAA 2 DAY TAT

## 2020-03-24 LAB — NOVEL CORONAVIRUS, NAA: SARS-CoV-2, NAA: DETECTED — AB

## 2020-03-25 ENCOUNTER — Encounter: Payer: Self-pay | Admitting: Physician Assistant

## 2020-03-25 ENCOUNTER — Ambulatory Visit (HOSPITAL_COMMUNITY)
Admission: RE | Admit: 2020-03-25 | Discharge: 2020-03-25 | Disposition: A | Discharge status: Home / Self Care | Payer: 59 | Source: Ambulatory Visit | Attending: Pulmonary Disease | Admitting: Pulmonary Disease

## 2020-03-25 ENCOUNTER — Encounter (HOSPITAL_COMMUNITY): Payer: Self-pay | Admitting: Adult Health

## 2020-03-25 ENCOUNTER — Other Ambulatory Visit (HOSPITAL_COMMUNITY): Payer: Self-pay | Admitting: Adult Health

## 2020-03-25 ENCOUNTER — Encounter: Payer: Self-pay | Admitting: Adult Health

## 2020-03-25 DIAGNOSIS — U071 COVID-19: Secondary | ICD-10-CM | POA: Diagnosis not present

## 2020-03-25 MED ORDER — FAMOTIDINE IN NACL 20-0.9 MG/50ML-% IV SOLN: 20.0000 mg | Freq: Once | INTRAVENOUS | Status: DC | PRN

## 2020-03-25 MED ORDER — EPINEPHRINE 0.3 MG/0.3ML IJ SOAJ: 0.3000 mg | Freq: Once | INTRAMUSCULAR | Status: DC | PRN

## 2020-03-25 MED ORDER — DIPHENHYDRAMINE HCL 50 MG/ML IJ SOLN: 50.0000 mg | Freq: Once | INTRAMUSCULAR | Status: DC | PRN

## 2020-03-25 MED ORDER — SODIUM CHLORIDE 0.9 % IV SOLN
1200.0000 mg | Freq: Once | INTRAVENOUS | Status: AC
  Administered 2020-03-25: 1200 mg via INTRAVENOUS

## 2020-03-25 MED ORDER — SODIUM CHLORIDE 0.9 % IV SOLN: INTRAVENOUS | Status: DC | PRN

## 2020-03-25 MED ORDER — METHYLPREDNISOLONE SODIUM SUCC 125 MG IJ SOLR: 125.0000 mg | Freq: Once | INTRAMUSCULAR | Status: DC | PRN

## 2020-03-25 MED ORDER — ALBUTEROL SULFATE HFA 108 (90 BASE) MCG/ACT IN AERS: 2.0000 | INHALATION_SPRAY | Freq: Once | RESPIRATORY_TRACT | Status: DC | PRN

## 2020-03-25 NOTE — Progress Notes (Signed)
I connected by phone with Ricardo Nelson on 03/25/2020 at 7:55 AM to discuss the potential use of a new treatment for mild to moderate COVID-19 viral infection in non-hospitalized patients.  This patient is a 54 y.o. male that meets the FDA criteria for Emergency Use Authorization of COVID monoclonal antibody casirivimab/imdevimab or bamlanivimab/eteseviamb.  Has a (+) direct SARS-CoV-2 viral test result  Has mild or moderate COVID-19   Is NOT hospitalized due to COVID-19  Is within 10 days of symptom onset  Has at least one of the high risk factor(s) for progression to severe COVID-19 and/or hospitalization as defined in EUA.  Specific high risk criteria : BMI > 25   I have spoken and communicated the following to the patient or parent/caregiver regarding COVID monoclonal antibody treatment:  1. FDA has authorized the emergency use for the treatment of mild to moderate COVID-19 in adults and pediatric patients with positive results of direct SARS-CoV-2 viral testing who are 60 years of age and older weighing at least 40 kg, and who are at high risk for progressing to severe COVID-19 and/or hospitalization.  2. The significant known and potential risks and benefits of COVID monoclonal antibody, and the extent to which such potential risks and benefits are unknown.  3. Information on available alternative treatments and the risks and benefits of those alternatives, including clinical trials.  4. Patients treated with COVID monoclonal antibody should continue to self-isolate and use infection control measures (e.g., wear mask, isolate, social distance, avoid sharing personal items, clean and disinfect "high touch" surfaces, and frequent handwashing) according to CDC guidelines.   5. The patient or parent/caregiver has the option to accept or refuse COVID monoclonal antibody treatment.  After reviewing this information with the patient, the patient has agreed to receive one of the available  covid 19 monoclonal antibodies and will be provided an appropriate fact sheet prior to infusion. Noreene Filbert, NP 03/25/2020 7:55 AM

## 2020-03-25 NOTE — Progress Notes (Signed)
  Diagnosis: COVID-19  Physician: Dr. Patrick  Wright  Procedure: Covid Infusion Clinic Med: casirivimab\imdevimab infusion - Provided patient with casirivimab\imdevimab fact sheet for patients, parents and caregivers prior to infusion.  Complications: No immediate complications noted.  Discharge: Discharged home   Ricardo Nelson 03/25/2020  

## 2020-03-25 NOTE — Discharge Instructions (Signed)

## 2020-03-26 ENCOUNTER — Telehealth: Payer: Self-pay

## 2020-03-26 NOTE — Telephone Encounter (Signed)
Called Ricardo Nelson - his girlfriend called this morning & spoke with Admin Asst stating he needed treatment for symptoms.  Covid test on 03/22/20 - at that time reports runny nose & sore throat for a couple of weeks.  Covid test results positive on 03/24/20  [name] went to [facility] yesterday for Monoclonal antibody treatment.  [name] reports that he's had a sore throat - concerned that it was strep throat (felt like pins & needles even when he drank water). Headache, temperature of 99.2, loss of sense of smell, but reports he can taste & no N/V Denies SOB, Chest Pain  States he's been taking Zinc, Sore throat spray, Claritin & Alka-seltzer Cold & Flu.  Informed him the PA in the clinic today looked at his chart & saw that he hasn't had a chest xray.  If he's in a lot of discomfort, she recommends he go to the ED to rule out Covid Pneumonia.  [name] said he refuses to go to the ED at this time.  He doesn't think it's necessary.  York Spaniel he feels better today than he did yesterday.  Once again denies SOB or any chest pain.  States he's only cough a couple of times today & that's trying to get phlegm out of the back of his throat.  States he will call back if symptoms worsen.  AMD

## 2020-03-28 ENCOUNTER — Telehealth: Payer: Self-pay

## 2020-03-28 NOTE — Telephone Encounter (Signed)
Called Ricardo Nelson to see how he's doing.  Reports no longer has sore throat and headaches, but still feels tired & diminished taste. States he's eating & drinking without issues. Denies SOB and chest pain.  AMD

## 2020-06-27 ENCOUNTER — Other Ambulatory Visit: Payer: Self-pay

## 2020-06-27 DIAGNOSIS — Z1152 Encounter for screening for COVID-19: Secondary | ICD-10-CM

## 2020-06-27 NOTE — Progress Notes (Signed)
Presents to [facility] for outdoor specimen collection for covid test.  Symptoms started 06/25/20 Runny nose Sneezing Fatigue Denies H/A or sore throat  Vaccinated > 6 months ago  Has had Covid & received Monoclonal antibodies at [facility] 03/2020. States they told him to wait 3 months before he can get a booster.  He's now at the point to get a booster.  Enc'd to wait for these covid results.  If they are negative to get a booster sometime this month.  Has Mychart  AMD

## 2020-06-29 LAB — SARS-COV-2, NAA 2 DAY TAT

## 2020-06-29 LAB — NOVEL CORONAVIRUS, NAA: SARS-CoV-2, NAA: DETECTED — AB

## 2020-07-19 ENCOUNTER — Telehealth: Payer: Self-pay | Admitting: General Practice

## 2020-07-19 NOTE — Telephone Encounter (Signed)
Pt. States he is taking his meds everyday.

## 2020-08-16 NOTE — Progress Notes (Signed)
Pt scheduled to complete physical with Ron [facility] 08/27/20. CL,RMA

## 2020-08-20 ENCOUNTER — Other Ambulatory Visit: Payer: Self-pay

## 2020-08-20 ENCOUNTER — Ambulatory Visit: Payer: Self-pay

## 2020-08-20 DIAGNOSIS — Z01818 Encounter for other preprocedural examination: Secondary | ICD-10-CM

## 2020-08-20 LAB — POCT URINALYSIS DIPSTICK
Bilirubin, UA: NEGATIVE
Blood, UA: NEGATIVE
Glucose, UA: NEGATIVE
Ketones, UA: NEGATIVE
Leukocytes, UA: NEGATIVE
Nitrite, UA: NEGATIVE
Protein, UA: NEGATIVE
Spec Grav, UA: >=1.03 — AB (ref 1.010–1.025)
Urobilinogen, UA: 0.2 E.U./dL
pH, UA: 5 (ref 5.0–8.0)

## 2020-08-21 LAB — CMP12+LP+TP+TSH+6AC+PSA+CBC…
ALT: 32 IU/L (ref 0–44)
AST: 19 IU/L (ref 0–40)
Albumin/Globulin Ratio: 1.6 (ref 1.2–2.2)
Albumin: 4.1 g/dL (ref 3.8–4.9)
Alkaline Phosphatase: 47 IU/L (ref 44–121)
BUN/Creatinine Ratio: 28 — ABNORMAL HIGH (ref 9–20)
BUN: 25 mg/dL — ABNORMAL HIGH (ref 6–24)
Basophils Absolute: 0.1 10*3/uL (ref 0.0–0.2)
Basos: 1 %
Bilirubin Total: 0.3 mg/dL (ref 0.0–1.2)
Calcium: 9.8 mg/dL (ref 8.7–10.2)
Chloride: 99 mmol/L (ref 96–106)
Chol/HDL Ratio: 4.7 ratio (ref 0.0–5.0)
Cholesterol, Total: 184 mg/dL (ref 100–199)
Creatinine, Ser: 0.89 mg/dL (ref 0.76–1.27)
EOS (ABSOLUTE): 0.2 10*3/uL (ref 0.0–0.4)
Eos: 3 %
Estimated CHD Risk: 1 times avg. (ref 0.0–1.0)
Free Thyroxine Index: 1.8 (ref 1.2–4.9)
GGT: 25 IU/L (ref 0–65)
Globulin, Total: 2.6 g/dL (ref 1.5–4.5)
Glucose: 179 mg/dL — ABNORMAL HIGH (ref 65–99)
HDL: 39 mg/dL — ABNORMAL LOW (ref >39)
Hematocrit: 49.6 % (ref 37.5–51.0)
Hemoglobin: 17.1 g/dL (ref 13.0–17.7)
Immature Grans (Abs): 0.1 10*3/uL (ref 0.0–0.1)
Immature Granulocytes: 1 %
Iron: 65 ug/dL (ref 38–169)
LDH: 176 IU/L (ref 121–224)
LDL Chol Calc (NIH): 104 mg/dL — ABNORMAL HIGH (ref 0–99)
Lymphocytes Absolute: 2.5 10*3/uL (ref 0.7–3.1)
Lymphs: 32 %
MCH: 29.7 pg (ref 26.6–33.0)
MCHC: 34.5 g/dL (ref 31.5–35.7)
MCV: 86 fL (ref 79–97)
Monocytes Absolute: 0.9 10*3/uL (ref 0.1–0.9)
Monocytes: 12 %
Neutrophils Absolute: 4.1 10*3/uL (ref 1.4–7.0)
Neutrophils: 51 %
Phosphorus: 4.5 mg/dL — ABNORMAL HIGH (ref 2.8–4.1)
Platelets: 262 10*3/uL (ref 150–450)
Potassium: 4.4 mmol/L (ref 3.5–5.2)
Prostate Specific Ag, Serum: 0.8 ng/mL (ref 0.0–4.0)
RBC: 5.76 x10E6/uL (ref 4.14–5.80)
RDW: 13.5 % (ref 11.6–15.4)
Sodium: 138 mmol/L (ref 134–144)
T3 Uptake Ratio: 28 % (ref 24–39)
T4, Total: 6.4 ug/dL (ref 4.5–12.0)
TSH: 1.8 u[IU]/mL (ref 0.450–4.500)
Total Protein: 6.7 g/dL (ref 6.0–8.5)
Triglycerides: 238 mg/dL — ABNORMAL HIGH (ref 0–149)
Uric Acid: 8.1 mg/dL (ref 3.8–8.4)
VLDL Cholesterol Cal: 41 mg/dL — ABNORMAL HIGH (ref 5–40)
WBC: 7.8 10*3/uL (ref 3.4–10.8)
eGFR: 101 mL/min/{1.73_m2} (ref >59)

## 2020-08-21 LAB — HGB A1C W/O EAG: Hgb A1c MFr Bld: 10 % — ABNORMAL HIGH (ref 4.8–5.6)

## 2020-08-21 LAB — MICROALBUMIN / CREATININE URINE RATIO
Creatinine, Urine: 97.1 mg/dL
Microalb/Creat Ratio: 5 mg/g creat (ref 0–29)
Microalbumin, Urine: 4.9 ug/mL

## 2020-08-23 ENCOUNTER — Emergency Department: Payer: 59

## 2020-08-23 ENCOUNTER — Other Ambulatory Visit: Payer: Self-pay

## 2020-08-23 ENCOUNTER — Encounter: Payer: Self-pay | Admitting: Emergency Medicine

## 2020-08-23 ENCOUNTER — Emergency Department
Admission: EM | Admit: 2020-08-23 | Discharge: 2020-08-23 | Disposition: A | Discharge status: Home / Self Care | Payer: 59 | Attending: Emergency Medicine | Admitting: Emergency Medicine

## 2020-08-23 DIAGNOSIS — K4020 Bilateral inguinal hernia, without obstruction or gangrene, not specified as recurrent: Secondary | ICD-10-CM | POA: Diagnosis not present

## 2020-08-23 DIAGNOSIS — N132 Hydronephrosis with renal and ureteral calculous obstruction: Secondary | ICD-10-CM | POA: Diagnosis not present

## 2020-08-23 DIAGNOSIS — Z79899 Other long term (current) drug therapy: Secondary | ICD-10-CM | POA: Insufficient documentation

## 2020-08-23 DIAGNOSIS — E119 Type 2 diabetes mellitus without complications: Secondary | ICD-10-CM | POA: Insufficient documentation

## 2020-08-23 DIAGNOSIS — R109 Unspecified abdominal pain: Secondary | ICD-10-CM

## 2020-08-23 DIAGNOSIS — Z7984 Long term (current) use of oral hypoglycemic drugs: Secondary | ICD-10-CM | POA: Diagnosis not present

## 2020-08-23 DIAGNOSIS — N23 Unspecified renal colic: Secondary | ICD-10-CM | POA: Diagnosis not present

## 2020-08-23 DIAGNOSIS — D3500 Benign neoplasm of unspecified adrenal gland: Secondary | ICD-10-CM | POA: Diagnosis not present

## 2020-08-23 DIAGNOSIS — K429 Umbilical hernia without obstruction or gangrene: Secondary | ICD-10-CM | POA: Diagnosis not present

## 2020-08-23 DIAGNOSIS — I10 Essential (primary) hypertension: Secondary | ICD-10-CM | POA: Diagnosis not present

## 2020-08-23 DIAGNOSIS — N201 Calculus of ureter: Secondary | ICD-10-CM | POA: Diagnosis not present

## 2020-08-23 LAB — CBC WITH DIFFERENTIAL/PLATELET
Abs Immature Granulocytes: 0.05 10*3/uL (ref 0.00–0.07)
Basophils Absolute: 0 10*3/uL (ref 0.0–0.1)
Basophils Relative: 0 %
Eosinophils Absolute: 0.1 10*3/uL (ref 0.0–0.5)
Eosinophils Relative: 2 %
HCT: 45.9 % (ref 39.0–52.0)
Hemoglobin: 15.5 g/dL (ref 13.0–17.0)
Immature Granulocytes: 1 %
Lymphocytes Relative: 15 %
Lymphs Abs: 1.2 10*3/uL (ref 0.7–4.0)
MCH: 29.8 pg (ref 26.0–34.0)
MCHC: 33.8 g/dL (ref 30.0–36.0)
MCV: 88.1 fL (ref 80.0–100.0)
Monocytes Absolute: 1.1 10*3/uL — ABNORMAL HIGH (ref 0.1–1.0)
Monocytes Relative: 14 %
Neutro Abs: 5.6 10*3/uL (ref 1.7–7.7)
Neutrophils Relative %: 68 %
Platelets: 204 10*3/uL (ref 150–400)
RBC: 5.21 MIL/uL (ref 4.22–5.81)
RDW: 13.2 % (ref 11.5–15.5)
WBC: 8.2 10*3/uL (ref 4.0–10.5)
nRBC: 0 % (ref 0.0–0.2)

## 2020-08-23 LAB — BASIC METABOLIC PANEL
Anion gap: 11 (ref 5–15)
BUN: 29 mg/dL — ABNORMAL HIGH (ref 6–20)
CO2: 23 mmol/L (ref 22–32)
Calcium: 9.3 mg/dL (ref 8.9–10.3)
Chloride: 102 mmol/L (ref 98–111)
Creatinine, Ser: 1.14 mg/dL (ref 0.61–1.24)
GFR, Estimated: >60 mL/min (ref >60)
Glucose, Bld: 170 mg/dL — ABNORMAL HIGH (ref 70–99)
Potassium: 3.8 mmol/L (ref 3.5–5.1)
Sodium: 136 mmol/L (ref 135–145)

## 2020-08-23 LAB — URINALYSIS, COMPLETE (UACMP) WITH MICROSCOPIC
Bacteria, UA: NONE SEEN
Bilirubin Urine: NEGATIVE
Glucose, UA: NEGATIVE mg/dL
Ketones, ur: NEGATIVE mg/dL
Leukocytes,Ua: NEGATIVE
Nitrite: NEGATIVE
Protein, ur: NEGATIVE mg/dL
RBC / HPF: >50 RBC/hpf — ABNORMAL HIGH (ref 0–5)
Specific Gravity, Urine: 1.018 (ref 1.005–1.030)
Squamous Epithelial / HPF: NONE SEEN (ref 0–5)
WBC, UA: NONE SEEN WBC/hpf (ref 0–5)
pH: 5 (ref 5.0–8.0)

## 2020-08-23 MED ORDER — ONDANSETRON 4 MG PO TBDP: 4.0000 mg | ORAL_TABLET | Freq: Three times a day (TID) | ORAL | 0 refills | Status: DC | PRN

## 2020-08-23 MED ORDER — OXYCODONE-ACETAMINOPHEN 5-325 MG PO TABS: 1.0000 | ORAL_TABLET | ORAL | 0 refills | Status: DC | PRN

## 2020-08-23 MED ORDER — KETOROLAC TROMETHAMINE 30 MG/ML IJ SOLN
15.0000 mg | Freq: Once | INTRAMUSCULAR | Status: AC
  Administered 2020-08-23: 15 mg via INTRAVENOUS
  Filled 2020-08-23: qty 1

## 2020-08-23 MED ORDER — SODIUM CHLORIDE 0.9 % IV BOLUS
1000.0000 mL | Freq: Once | INTRAVENOUS | Status: AC
  Administered 2020-08-23: 1000 mL via INTRAVENOUS

## 2020-08-23 MED ORDER — TAMSULOSIN HCL 0.4 MG PO CAPS: 0.4000 mg | ORAL_CAPSULE | Freq: Every day | ORAL | 0 refills | Status: DC

## 2020-08-23 NOTE — ED Provider Notes (Signed)
[facility] Emergency Department Provider Note   ____________________________________________   Event Date/Time   First MD Initiated Contact with Patient 08/23/20 [phone]     (approximate)  I have reviewed the triage vital signs and the nursing notes.   HISTORY  Chief Complaint Flank Pain    HPI Ricardo Nelson is a 55 y.o. male who presents to the ED from home with a chief complaint of left flank pain.  Patient reports left flank discomfort last evening while attending an auction.  He did lift an heavy object as well.  History of kidney stones.  Last week did not have any flank pain but had an episode of hematuria and thinks he passed a kidney stone because he heard something dropped into the commode. Denies associated fever, chills, chest pain, shortness of breath, abdominal pain, nausea, vomiting, testicular pain or swelling.     Past Medical History:  Diagnosis Date  . Diabetes mellitus without complication ([place])   . Gout   . Hyperlipemia   . Hypertension     There are no problems to display for this patient.   Past Surgical History:  Procedure Laterality Date  . APPENDECTOMY    . TONSILLECTOMY      Prior to Admission medications   Medication Sig Start Date End Date Taking? Authorizing Provider  ondansetron (ZOFRAN ODT) 4 MG disintegrating tablet Take 1 tablet (4 mg total) by mouth every 8 (eight) hours as needed for nausea or vomiting. 08/23/20  Yes Paulette Blanch, MD  oxyCODONE-acetaminophen (PERCOCET/ROXICET) 5-325 MG tablet Take 1 tablet by mouth every 4 (four) hours as needed for severe pain. 08/23/20  Yes Paulette Blanch, MD  tamsulosin (FLOMAX) 0.4 MG CAPS capsule Take 1 capsule (0.4 mg total) by mouth daily. 08/23/20  Yes Paulette Blanch, MD  atorvastatin (LIPITOR) 10 MG tablet Take by mouth.    [provider]  atorvastatin (LIPITOR) 20 MG tablet TAKE 1 TABLET BY MOUTH EVERY DAY 03/14/20   Sable Feil, PA-C  Blood Glucose Monitoring  Suppl w/Device KIT Use to check blood sugar as instructed 07/13/19   Jan Fireman, PA-C  fluticasone [facility]) 50 MCG/ACT nasal spray Place into the nose.    [provider]  glucose blood test strip Use as instructed 07/13/19   Jan Fireman, PA-C  indomethacin (INDOCIN) 50 MG capsule Take 1 capsule (50 mg total) by mouth 2 (two) times daily as needed (use lowest effective dose, shortest effective duration- take with food when possible). 07/18/19   Jan Fireman, PA-C  Lancets (FREESTYLE) lancets Use as instructed 07/13/19   Jan Fireman, PA-C  lisinopril-hydrochlorothiazide (ZESTORETIC) 20-12.5 MG tablet Take 2 tablets by mouth daily. 12/04/19   Johnn Hai, PA-C  metFORMIN (GLUCOPHAGE-XR) 500 MG 24 hr tablet Take 500 mg by mouth 2 (two) times daily. 02/21/19   [provider]    Allergies Patient has no known allergies.  No family history on file.  Social History Social History   Tobacco Use  . Smoking status: Never Smoker  . Smokeless tobacco: Never Used  Vaping Use  . Vaping Use: Never used  Substance Use Topics  . Alcohol use: Yes    Comment: 2 drinks a week  . Drug use: Not Currently    Review of Systems  Constitutional: No fever/chills Eyes: No visual changes. ENT: No sore throat. Cardiovascular: Denies chest pain. Respiratory: Denies shortness of breath. Gastrointestinal: Positive for left flank pain.  No  abdominal pain.  No nausea, no vomiting.  No diarrhea.  No constipation. Genitourinary: Negative for dysuria. Musculoskeletal: Negative for back pain. Skin: Negative for rash. Neurological: Negative for headaches, focal weakness or numbness.   ____________________________________________   PHYSICAL EXAM:  VITAL SIGNS: ED Triage Vitals  Enc Vitals Group     BP 08/23/20 0449 124/82     Pulse Rate 08/23/20 0451 93     Resp 08/23/20 0449 18     Temp 08/23/20 0449 97.7 F (36.5 C)     Temp Source 08/23/20 0449 Oral     SpO2 08/23/20 0451  98 %     Weight 08/23/20 0448 (!) 303 lb (137.4 kg)     Height 08/23/20 0448 _0  (1.803 m)     Head Circumference --      Peak Flow --      Pain Score 08/23/20 0447 8     Pain Loc --      Pain Edu? --      Excl. in [place]? --     Constitutional: Alert and oriented. Well appearing and in mild acute distress. Eyes: Conjunctivae are normal. PERRL. EOMI. Head: Atraumatic. Nose: No congestion/rhinnorhea. Mouth/Throat: Mucous membranes are moist.   Neck: No stridor.   Cardiovascular: Normal rate, regular rhythm. Grossly normal heart sounds.  Good peripheral circulation. Respiratory: Normal respiratory effort.  No retractions. Lungs CTAB. Gastrointestinal: Soft and nontender to D palpation. No distention. No abdominal bruits.  Mild left CVA tenderness. Musculoskeletal: No lower extremity tenderness nor edema.  No joint effusions. Neurologic:  Normal speech and language. No gross focal neurologic deficits are appreciated. No gait instability. Skin:  Skin is warm, dry and intact. No rash noted.  No vesicles. Psychiatric: Mood and affect are normal. Speech and behavior are normal.  ____________________________________________   LABS (all labs ordered are listed, but only abnormal results are displayed)  Labs Reviewed  CBC WITH DIFFERENTIAL/PLATELET - Abnormal; Notable for the following components:      Result Value   Monocytes Absolute 1.1 (*)    All other components within normal limits  BASIC METABOLIC PANEL - Abnormal; Notable for the following components:   Glucose, Bld 170 (*)    BUN 29 (*)    All other components within normal limits  URINALYSIS, COMPLETE (UACMP) WITH MICROSCOPIC - Abnormal; Notable for the following components:   Color, Urine YELLOW (*)    APPearance CLOUDY (*)    Hgb urine dipstick LARGE (*)    RBC / HPF >50 (*)    All other components within normal limits    ____________________________________________  EKG  None ____________________________________________  RADIOLOGY I, Keilah Lemire J, personally viewed and evaluated these images (plain radiographs) as part of my medical decision making, as well as reviewing the written report by the radiologist.  ED MD interpretation: 4 mm left UPJ stone with mild hydronephrosis  Official radiology report(s): CT Renal Stone Study  Result Date: 08/23/2020 CLINICAL DATA:  Left flank pain EXAM: CT ABDOMEN AND PELVIS WITHOUT CONTRAST TECHNIQUE: Multidetector CT imaging of the abdomen and pelvis was performed following the standard protocol without IV contrast. COMPARISON:  04/16/2012 FINDINGS: Lower chest: Visualized lung bases are clear. The visualized heart and pericardium are unremarkable. Hepatobiliary: No focal liver abnormality is seen. No gallstones, gallbladder wall thickening, or biliary dilatation. Pancreas: Unremarkable Spleen: Unremarkable Adrenals/Urinary Tract: The right adrenal gland is unremarkable. Stable 4.6 cm left adrenal adenoma. The kidneys are normal in size and position. There is mild left hydronephrosis and  moderate perinephric stranding secondary to an obstructing 4 mm calculus within the left ureteropelvic junction. No additional nephro or urolithiasis. No hydronephrosis on the right. The bladder is unremarkable. Stomach/Bowel: A densely calcified densities again seen within the subxiphoid region of the abdomen within the mesenteric fat demonstrating increasing surrounding fluid component likely representing an area of fat necrosis and possibly the sequela of remote trauma. The stomach, small bowel, and large bowel are unremarkable. Appendix absent. No free intraperitoneal gas or fluid. Vascular/Lymphatic: Mild aortoiliac atherosclerotic calcification. No aortic aneurysm. No pathologic adenopathy within the abdomen and pelvis. Reproductive: Prostate is unremarkable. Other: Small fat containing  umbilical hernia. Tiny fat containing bilateral inguinal hernias. Musculoskeletal: No lytic or blastic bone lesions. No acute bone abnormality. IMPRESSION: Obstructing 4 mm calculus at the left ureteropelvic junction resulting in mild left hydronephrosis. Electronically Signed   By: Fidela Salisbury MD   On: 08/23/2020 06:18    ____________________________________________   PROCEDURES  Procedure(s) performed (including Critical Care):  Procedures   ____________________________________________   INITIAL IMPRESSION / ASSESSMENT AND PLAN / ED COURSE  As part of my medical decision making, I reviewed the following data within the [place] notes reviewed and incorporated, Labs reviewed, Old chart reviewed, Radiograph reviewed, Notes from prior ED visits and [place] Controlled Substance Database     55 year old male presenting with left flank pain. Differential diagnosis includes, but is not limited to, acute appendicitis, renal colic, testicular torsion, urinary tract infection/pyelonephritis, prostatitis,  epididymitis, diverticulitis, small bowel obstruction or ileus, colitis, abdominal aortic aneurysm, gastroenteritis, hernia, etc.  Obtain basic lab work, urinalysis, CT renal colic study.  Initiate IV fluid hydration, IV Toradol for pain and reassess.  Clinical Course as of 08/23/20 0644  Fri Aug 23, 2020  0643 Patient feeling better after Toradol.  Updated him on all laboratory and imaging results.  Will discharge home on Flomax and as needed Percocet/Zofran.  He will follow up with urology as needed.  Strict return precautions given.  Patient verbalizes understanding and agrees with plan of care. [JS]    Clinical Course User Index [JS] Paulette Blanch, MD     ____________________________________________   FINAL CLINICAL IMPRESSION(S) / ED DIAGNOSES  Final diagnoses:  Left flank pain  Ureteral colic     ED Discharge Orders         Ordered    tamsulosin  (FLOMAX) 0.4 MG CAPS capsule  Daily        08/23/20 0630    oxyCODONE-acetaminophen (PERCOCET/ROXICET) 5-325 MG tablet  Every 4 hours PRN        08/23/20 0630    ondansetron (ZOFRAN ODT) 4 MG disintegrating tablet  Every 8 hours PRN        08/23/20 0630          *Please note:  Ricardo Nelson was evaluated in Emergency Department on 08/23/2020 for the symptoms described in the history of present illness. He was evaluated in the context of the global COVID-19 pandemic, which necessitated consideration that the patient might be at risk for infection with the SARS-CoV-2 virus that causes COVID-19. Institutional protocols and algorithms that pertain to the evaluation of patients at risk for COVID-19 are in a state of rapid change based on information released by regulatory bodies including the CDC and federal and state organizations. These policies and algorithms were followed during the patient's care in the ED.  Some ED evaluations and interventions may be delayed as a result of limited staffing during  and the pandemic.*   Note:  This document was prepared using Dragon voice recognition software and may include unintentional dictation errors.   Paulette Blanch, MD 08/23/20 [phone]

## 2020-08-23 NOTE — Discharge Instructions (Addendum)
1. Take pain & nausea medicines as needed (Percocet/Zofran #30). Make sure to take a stool softener while taking narcotic pain medicines. 2. Take Flomax 0.4mg daily x 14 days. 3. Drink plenty of bottled or filtered water daily. 4. Return to the ER for worsening symptoms, persistent vomiting, fever, difficulty breathing or other concerns.  

## 2020-08-23 NOTE — ED Notes (Signed)
Patient transported to CT 

## 2020-08-23 NOTE — ED Triage Notes (Signed)
Patient ambulatory to triage with steady gait, without difficulty or distress noted; pt reports left flank pain radiating into left side and abd since last night with no accomp symptoms; st hx kidney stone

## 2020-08-27 ENCOUNTER — Other Ambulatory Visit: Payer: Self-pay

## 2020-08-27 ENCOUNTER — Encounter: Payer: Self-pay | Admitting: Nurse Practitioner

## 2020-08-27 ENCOUNTER — Ambulatory Visit: Payer: Self-pay | Admitting: Nurse Practitioner

## 2020-08-27 VITALS — BP 131/85 | HR 85 | Temp 98.7°F | Resp 14 | Ht 71.0 in | Wt 299.0 lb

## 2020-08-27 DIAGNOSIS — E119 Type 2 diabetes mellitus without complications: Secondary | ICD-10-CM

## 2020-08-27 DIAGNOSIS — E781 Pure hyperglyceridemia: Secondary | ICD-10-CM

## 2020-08-27 DIAGNOSIS — N200 Calculus of kidney: Secondary | ICD-10-CM

## 2020-08-27 MED ORDER — METFORMIN HCL 850 MG PO TABS: 850.0000 mg | ORAL_TABLET | Freq: Two times a day (BID) | ORAL | 0 refills | Status: DC

## 2020-08-27 MED ORDER — ICOSAPENT ETHYL 1 G PO CAPS: 2.0000 g | ORAL_CAPSULE | Freq: Two times a day (BID) | ORAL | 3 refills | Status: DC

## 2020-08-27 NOTE — Progress Notes (Signed)
Subjective:    Patient ID: Ricardo Nelson, male    DOB: [birth year], 55 y.o.   MRN: [id number]  HPI  55 year old male presenting to [place] for annual physical. Patient presents in acute pain today, was in ED 4 day ago with Kidney stone imaging from CT is as follows: IMPRESSION: Obstructing 4 mm calculus at the left ureteropelvic junction resulting in mild left hydronephrosis. Patient has been using Percocet for pain control and has not had a BM since 08/24/2019. Has abdominal cramping today and left flank pain.   Historically he has: Past Medical History:  Diagnosis Date  . Diabetes mellitus without complication ([place])   . Gout   . Hyperlipemia   . Hypertension     Daily Meds: Lipitor 76m Linisopril/HCTZ 20-12.5 Metformin 500 twice daily   ER meds: PErcocet5--325 Tamsulosin 0.481m Detailed history not gathered as patient is in acute pain/very uncomfortable in clinic today from kidney stone and constipation   Review of Systems  Constitutional: Negative.   HENT: Negative.   Respiratory: Negative.   Cardiovascular: Negative.   Gastrointestinal: Positive for constipation.  Genitourinary: Positive for flank pain.  Neurological: Negative.   Psychiatric/Behavioral: Negative.        Objective:   Physical Exam Constitutional:      Appearance: He is ill-appearing.  HENT:     Head: Normocephalic.  Abdominal:     General: Bowel sounds are decreased. There is distension.     Tenderness: There is no right CVA tenderness or left CVA tenderness.  Skin:    General: Skin is warm.  Neurological:     Mental Status: He is oriented to person, place, and time.    Recent Results (from the past 2160 hour(s))  Novel Coronavirus, NAA ([facility])     Status: Abnormal   Collection Time: 06/27/20 12:01 PM   Specimen: Nasopharyngeal(NP) swabs in vial transport medium   Nasopharynge  Result Value Ref Range   SARS-CoV-2, NAA Detected (A) Not Detected    Comment: Patients who have a  positive COVID-19 test result may now have treatment options. Treatment options are available for patients with mild to moderate symptoms and for hospitalized patients. Visit our website at [email or web address] resources and information. This nucleic acid amplification test was developed and its performance characteristics determined by La[employer], [employer] and CompanyNucleic acid amplification tests include RT-PCR and TMA. This test has not been FDA cleared or approved. This test has been authorized by FDA under an Emergency Use Authorization (EUA). This test is only authorized for the duration of time the declaration that circumstances exist justifying the authorization of the emergency use of in vitro diagnostic tests for detection of SARS-CoV-2 virus and/or diagnosis of COVID-19 infection under section 564(b)(1) of the Act, 21 U.S.C. 36993TTS-1(X(1), unless the authorization is terminated or revoked sooner. When diagnostic testing is negativ e, the possibility of a false negative result should be considered in the context of a patient's recent exposures and the presence of clinical signs and symptoms consistent with COVID-19. An individual without symptoms of COVID-19 and who is not shedding SARS-CoV-2 virus would expect to have a negative (not detected) result in this assay.   SARS-COV-2, NAA 2 DAY TAT     Status: None   Collection Time: 06/27/20 12:01 PM   Nasopharynge  Result Value Ref Range   SARS-CoV-2, NAA 2 DAY TAT Performed   Microalbumin / creatinine urine ratio     Status: None   Collection Time: 08/20/20  8:10  AM  Result Value Ref Range   Creatinine, Urine 97.1 Not Estab. mg/dL   Microalbumin, Urine 4.9 Not Estab. ug/mL   Microalb/Creat Ratio 5 0 - 29 mg/g creat    Comment:                        Normal:                0 -  29                        Moderately increased: 30 - 300                        Severely increased:       >300   Hgb A1c w/o eAG      Status: Abnormal   Collection Time: 08/20/20  8:10 AM  Result Value Ref Range   Hgb A1c MFr Bld 10.0 (H) 4.8 - 5.6 %    Comment:          Prediabetes: 5.7 - 6.4          Diabetes: >6.4          Glycemic control for adults with diabetes: <7.0   CMP12+LP+TP+TSH+6AC+PSA+CBC.     Status: Abnormal   Collection Time: 08/20/20  8:10 AM  Result Value Ref Range   Glucose 179 (H) 65 - 99 mg/dL   Uric Acid 8.1 3.8 - 8.4 mg/dL    Comment:            Therapeutic target for gout patients: <6.0   BUN 25 (H) 6 - 24 mg/dL   Creatinine, Ser 0.89 0.76 - 1.27 mg/dL   eGFR 101 >59 mL/min/1.73    Comment: **In accordance with recommendations from the NKF-ASN Task force,**   [facility] has updated its eGFR calculation to the 2021 CKD-EPI   creatinine equation that estimates kidney function without a race   variable.    BUN/Creatinine Ratio 28 (H) 9 - 20   Sodium 138 134 - 144 mmol/L   Potassium 4.4 3.5 - 5.2 mmol/L   Chloride 99 96 - 106 mmol/L   Calcium 9.8 8.7 - 10.2 mg/dL   Phosphorus 4.5 (H) 2.8 - 4.1 mg/dL   Total Protein 6.7 6.0 - 8.5 g/dL   Albumin 4.1 3.8 - 4.9 g/dL   Globulin, Total 2.6 1.5 - 4.5 g/dL   Albumin/Globulin Ratio 1.6 1.2 - 2.2   Bilirubin Total 0.3 0.0 - 1.2 mg/dL   Alkaline Phosphatase 47 44 - 121 IU/L   LDH 176 121 - 224 IU/L   AST 19 0 - 40 IU/L   ALT 32 0 - 44 IU/L   GGT 25 0 - 65 IU/L   Iron 65 38 - 169 ug/dL   Cholesterol, Total 184 100 - 199 mg/dL   Triglycerides 238 (H) 0 - 149 mg/dL   HDL 39 (L) >39 mg/dL   VLDL Cholesterol Cal 41 (H) 5 - 40 mg/dL   LDL Chol Calc (NIH) 104 (H) 0 - 99 mg/dL   Chol/HDL Ratio 4.7 0.0 - 5.0 ratio    Comment:                                   T. Chol/HDL Ratio  Men  Women                               1/2 Avg.Risk  3.4    3.3                                   Avg.Risk  5.0    4.4                                2X Avg.Risk  9.6    7.1                                3X Avg.Risk 23.4   11.0     Estimated CHD Risk 1.0 0.0 - 1.0 times avg.    Comment: The CHD Risk is based on the T. Chol/HDL ratio. Other factors affect CHD Risk such as hypertension, smoking, diabetes, severe obesity, and family history of premature CHD.    TSH 1.800 0.450 - 4.500 uIU/mL   T4, Total 6.4 4.5 - 12.0 ug/dL   T3 Uptake Ratio 28 24 - 39 %   Free Thyroxine Index 1.8 1.2 - 4.9   Prostate Specific Ag, Serum 0.8 0.0 - 4.0 ng/mL    Comment: Roche ECLIA methodology. According to the American Urological Association, Serum PSA should decrease and remain at undetectable levels after radical prostatectomy. The AUA defines biochemical recurrence as an initial PSA value 0.2 ng/mL or greater followed by a subsequent confirmatory PSA value 0.2 ng/mL or greater. Values obtained with different assay methods or kits cannot be used interchangeably. Results cannot be interpreted as absolute evidence of the presence or absence of malignant disease.    WBC 7.8 3.4 - 10.8 x10E3/uL   RBC 5.76 4.14 - 5.80 x10E6/uL   Hemoglobin 17.1 13.0 - 17.7 g/dL   Hematocrit 49.6 37.5 - 51.0 %   MCV 86 79 - 97 fL   MCH 29.7 26.6 - 33.0 pg   MCHC 34.5 31.5 - 35.7 g/dL   RDW 13.5 11.6 - 15.4 %   Platelets 262 150 - 450 x10E3/uL   Neutrophils 51 Not Estab. %   Lymphs 32 Not Estab. %   Monocytes 12 Not Estab. %   Eos 3 Not Estab. %   Basos 1 Not Estab. %   Neutrophils Absolute 4.1 1.4 - 7.0 x10E3/uL   Lymphocytes Absolute 2.5 0.7 - 3.1 x10E3/uL   Monocytes Absolute 0.9 0.1 - 0.9 x10E3/uL   EOS (ABSOLUTE) 0.2 0.0 - 0.4 x10E3/uL   Basophils Absolute 0.1 0.0 - 0.2 x10E3/uL   Immature Granulocytes 1 Not Estab. %   Immature Grans (Abs) 0.1 0.0 - 0.1 x10E3/uL  POCT urinalysis dipstick     Status: Abnormal   Collection Time: 08/20/20  8:52 AM  Result Value Ref Range   Color, UA yellow    Clarity, UA clear    Glucose, UA Negative Negative   Bilirubin, UA negative    Ketones, UA negative    Spec Grav, UA >=1.030 (A) 1.010 -  1.025   Blood, UA negative    pH, UA 5.0 5.0 - 8.0   Protein, UA Negative Negative   Urobilinogen, UA 0.2 0.2 or 1.0 E.U./dL   Nitrite, UA negative  Leukocytes, UA Negative Negative   Appearance medium    Odor    CBC with Differential     Status: Abnormal   Collection Time: 08/23/20  5:20 AM  Result Value Ref Range   WBC 8.2 4.0 - 10.5 K/uL   RBC 5.21 4.22 - 5.81 MIL/uL   Hemoglobin 15.5 13.0 - 17.0 g/dL   HCT 45.9 39.0 - 52.0 %   MCV 88.1 80.0 - 100.0 fL   MCH 29.8 26.0 - 34.0 pg   MCHC 33.8 30.0 - 36.0 g/dL   RDW 13.2 11.5 - 15.5 %   Platelets 204 150 - 400 K/uL   nRBC 0.0 0.0 - 0.2 %   Neutrophils Relative % 68 %   Neutro Abs 5.6 1.7 - 7.7 K/uL   Lymphocytes Relative 15 %   Lymphs Abs 1.2 0.7 - 4.0 K/uL   Monocytes Relative 14 %   Monocytes Absolute 1.1 (H) 0.1 - 1.0 K/uL   Eosinophils Relative 2 %   Eosinophils Absolute 0.1 0.0 - 0.5 K/uL   Basophils Relative 0 %   Basophils Absolute 0.0 0.0 - 0.1 K/uL   Immature Granulocytes 1 %   Abs Immature Granulocytes 0.05 0.00 - 0.07 K/uL    Comment: Performed at [facility], [address], [place], [place] [address]  Basic metabolic panel     Status: Abnormal   Collection Time: 08/23/20  5:20 AM  Result Value Ref Range   Sodium 136 135 - 145 mmol/L   Potassium 3.8 3.5 - 5.1 mmol/L   Chloride 102 98 - 111 mmol/L   CO2 23 22 - 32 mmol/L   Glucose, Bld 170 (H) 70 - 99 mg/dL    Comment: Glucose reference range applies only to samples taken after fasting for at least 8 hours.   BUN 29 (H) 6 - 20 mg/dL   Creatinine, Ser 1.14 0.61 - 1.24 mg/dL   Calcium 9.3 8.9 - 10.3 mg/dL   GFR, Estimated >60 >60 mL/min    Comment: (NOTE) Calculated using the CKD-EPI Creatinine Equation (2021)    Anion gap 11 5 - 15    Comment: Performed at [facility], [address] [place], [place] [address]  Urinalysis, Complete w Microscopic Urine, Clean Catch     Status: Abnormal   Collection Time: 08/23/20  5:27 AM  Result  Value Ref Range   Color, Urine YELLOW (A) YELLOW   APPearance CLOUDY (A) CLEAR   Specific Gravity, Urine 1.018 1.005 - 1.030   pH 5.0 5.0 - 8.0   Glucose, UA NEGATIVE NEGATIVE mg/dL   Hgb urine dipstick LARGE (A) NEGATIVE   Bilirubin Urine NEGATIVE NEGATIVE   Ketones, ur NEGATIVE NEGATIVE mg/dL   Protein, ur NEGATIVE NEGATIVE mg/dL   Nitrite NEGATIVE NEGATIVE   Leukocytes,Ua NEGATIVE NEGATIVE   RBC / HPF >50 (H) 0 - 5 RBC/hpf   WBC, UA NONE SEEN 0 - 5 WBC/hpf   Bacteria, UA NONE SEEN NONE SEEN   Squamous Epithelial / LPF NONE SEEN 0 - 5   Uric Acid Crys, UA PRESENT     Comment: Performed at [facility], [address], [place], [place] [address]   Note: abnormal urine dip was from ED visit- Limited Exam performed due to acute pain     Assessment & Plan:  1. Increased triglycerides: Start Vascepa, if Rx price is high substitute for OTC fish oil  2. A1c elevation- increased Metformin from 500 to 850 BID repeat labs in  3 months, continue to work on diet and exercise  3. Kidney Stone- day 4 since ER visit with evidence of obstruction on CT- urgent referral to Urology  4. Constipation-likely secondary to narcotic use for kidney stone pain control, increase water intake-discussed OTC laxative use- should resolved when narcotic use is stopped  Repeat lipid panel, BMP & A1C in 3 months (June 2022)  Return to clinic earlier with any new concerns  -discuss diabetic eye exam at next appointment, perform foot exam  Meds ordered this encounter  Medications  . icosapent Ethyl (VASCEPA) 1 g capsule    Sig: Take 2 capsules (2 g total) by mouth 2 (two) times daily.    Dispense:  120 capsule    Refill:  3  . metFORMIN (GLUCOPHAGE) 850 MG tablet    Sig: Take 1 tablet (850 mg total) by mouth 2 (two) times daily with a meal.    Dispense:  180 tablet    Refill:  0

## 2020-08-27 NOTE — Patient Instructions (Signed)

## 2020-09-03 ENCOUNTER — Other Ambulatory Visit: Payer: Self-pay | Admitting: Urology

## 2020-09-03 ENCOUNTER — Other Ambulatory Visit: Payer: Self-pay | Admitting: *Deleted

## 2020-09-03 ENCOUNTER — Ambulatory Visit
Admission: RE | Admit: 2020-09-03 | Discharge: 2020-09-03 | Disposition: A | Discharge status: Home / Self Care | Payer: 59 | Source: Ambulatory Visit | Attending: Urology | Admitting: Urology

## 2020-09-03 ENCOUNTER — Other Ambulatory Visit
Admission: RE | Admit: 2020-09-03 | Discharge: 2020-09-03 | Disposition: A | Discharge status: Home / Self Care | Payer: 59 | Source: Home / Self Care | Attending: Urology | Admitting: Urology

## 2020-09-03 ENCOUNTER — Ambulatory Visit (INDEPENDENT_AMBULATORY_CARE_PROVIDER_SITE_OTHER): Payer: 59 | Admitting: Urology

## 2020-09-03 ENCOUNTER — Other Ambulatory Visit: Payer: Self-pay | Admitting: Family Medicine

## 2020-09-03 ENCOUNTER — Other Ambulatory Visit: Payer: Self-pay

## 2020-09-03 ENCOUNTER — Ambulatory Visit
Admission: RE | Admit: 2020-09-03 | Discharge: 2020-09-03 | Disposition: A | Discharge status: Home / Self Care | Payer: 59 | Attending: Urology | Admitting: Urology

## 2020-09-03 ENCOUNTER — Encounter: Payer: Self-pay | Admitting: Urology

## 2020-09-03 VITALS — BP 115/76 | HR 83 | Ht 71.0 in | Wt 292.0 lb

## 2020-09-03 DIAGNOSIS — N200 Calculus of kidney: Secondary | ICD-10-CM | POA: Diagnosis not present

## 2020-09-03 DIAGNOSIS — N201 Calculus of ureter: Secondary | ICD-10-CM | POA: Diagnosis not present

## 2020-09-03 DIAGNOSIS — Z87442 Personal history of urinary calculi: Secondary | ICD-10-CM | POA: Diagnosis not present

## 2020-09-03 LAB — URINALYSIS, COMPLETE (UACMP) WITH MICROSCOPIC
Bacteria, UA: NONE SEEN
Bilirubin Urine: NEGATIVE
Glucose, UA: NEGATIVE mg/dL
Ketones, ur: NEGATIVE mg/dL
Nitrite: NEGATIVE
Protein, ur: 100 mg/dL — AB
Specific Gravity, Urine: 1.025 (ref 1.005–1.030)
Squamous Epithelial / HPF: NONE SEEN (ref 0–5)
pH: 5 (ref 5.0–8.0)

## 2020-09-03 MED ORDER — CEFAZOLIN SODIUM-DEXTROSE 2-4 GM/100ML-% IV SOLN
2.0000 g | INTRAVENOUS | Status: AC
  Administered 2020-09-04: 2 g via INTRAVENOUS

## 2020-09-03 NOTE — Progress Notes (Signed)
 09/03/20 10:06 AM   Ricardo Nelson 10/01/1965 [id number]  CC: Left proximal ureteral stone  HPI: I saw Ricardo Nelson in urology clinic today for evaluation of a 6 mm left proximal ureteral stone.  He is a 55-year-old male with history of morbid obesity and diabetes who presented to the ED on 08/23/2020 with left-sided flank pain, and CT showed a 6 mm left proximal ureteral stone with upstream hydronephrosis, and no other renal stones.  Urinalysis showed microscopic hematuria and he was discharged with medical expulsive therapy.  He reports 4-5 prior stone episodes, but has never required surgery.  He denies any urinary symptoms of dysuria, urgency, or frequency.  He continues to have intermittent left-sided flank pain.  He denies any fevers or chills.  Urinalysis today with persistent microscopic hematuria with 6-10 RBCs, 11-20 WBCs, no bacteria, small leukocytes, nitrite negative.  PMH: Past Medical History:  Diagnosis Date  . Diabetes mellitus without complication (HCC)   . Gout   . Hyperlipemia   . Hypertension     Surgical History: Past Surgical History:  Procedure Laterality Date  . APPENDECTOMY    . TONSILLECTOMY      Social History:  reports that he has never smoked. He has never used smokeless tobacco. He reports current alcohol use. He reports previous drug use.  Physical Exam: BP 115/76   Pulse 83   Ht 5' 11" (1.803 m)   Wt 292 lb (132.5 kg)   BMI 40.73 kg/m    Constitutional:  Alert and oriented, No acute distress. Cardiovascular: Regular rate and rhythm Respiratory: Clear to auscultation bilaterally GI: Abdomen is soft, nontender, nondistended, no abdominal masses  Laboratory Data: Reviewed, see HPI  Pertinent Imaging: I have personally viewed and interpreted the CT stone protocol dated 08/23/2020 that shows a 6 mm left proximal ureteral stone with upstream hydronephrosis. 350 HU, SSD 18cm, suspect uric acid stone.  No other renal stones.  Stone not visible  on KUB today(uric acid).  Assessment & Plan:   55-year-old male with 6 mm left proximal ureteral stone, likely uric acid based on stone density and urine pH.  No laboratory or clinical evidence of infection.  We discussed various treatment options for urolithiasis including observation with or without medical expulsive therapy, shockwave lithotripsy (SWL), ureteroscopy and laser lithotripsy with stent placement, and percutaneous nephrolithotomy.  We discussed that management is based on stone size, location, density, patient co-morbidities, and patient preference.   Stones <5mm in size have a >80% spontaneous passage rate. Data surrounding the use of tamsulosin for medical expulsive therapy is controversial, but meta analyses suggests it is most efficacious for distal stones between 5-10mm in size. Possible side effects include dizziness/lightheadedness, and retrograde ejaculation.  SWL has a lower stone free rate in a single procedure, but also a lower complication rate compared to ureteroscopy and avoids a stent and associated stent related symptoms. Possible complications include renal hematoma, steinstrasse, and need for additional treatment.  Shockwave lithotripsy is not an option for him with his uric acid stone and not visible on KUB, in addition to significant skin to stone distance.  Ureteroscopy with laser lithotripsy and stent placement has a higher stone free rate than SWL in a single procedure, however increased complication rate including possible infection, ureteral injury, bleeding, and stent related morbidity. Common stent related symptoms include dysuria, urgency/frequency, and flank pain.  We discussed an additional week of medical expulsive therapy, versus adding to the OR this week for left ureteroscopy, laser lithotripsy,   stent placement.  With his ongoing symptoms over the last few weeks and larger stone, he would like to pursue ureteroscopy this week which is very  reasonable.  Schedule left ureteroscopy, laser lithotripsy, stent placement tomorrow   Tanieka Pownall, MD 09/03/2020  [facility] [address], [address] [place], [place] [address] [phone]   

## 2020-09-03 NOTE — H&P (View-Only) (Signed)
09/03/20 10:06 AM   Ricardo Nelson Mar 21, 1966 [id number]  CC: Left proximal ureteral stone  HPI: I saw Ricardo Nelson in urology clinic today for evaluation of a 6 mm left proximal ureteral stone.  He is a 55 year old male with history of morbid obesity and diabetes who presented to the ED on 08/23/2020 with left-sided flank pain, and CT showed a 6 mm left proximal ureteral stone with upstream hydronephrosis, and no other renal stones.  Urinalysis showed microscopic hematuria and he was discharged with medical expulsive therapy.  He reports 4-5 prior stone episodes, but has never required surgery.  He denies any urinary symptoms of dysuria, urgency, or frequency.  He continues to have intermittent left-sided flank pain.  He denies any fevers or chills.  Urinalysis today with persistent microscopic hematuria with 6-10 RBCs, 11-20 WBCs, no bacteria, small leukocytes, nitrite negative.  PMH: Past Medical History:  Diagnosis Date  . Diabetes mellitus without complication (HCC)   . Gout   . Hyperlipemia   . Hypertension     Surgical History: Past Surgical History:  Procedure Laterality Date  . APPENDECTOMY    . TONSILLECTOMY      Social History:  reports that he has never smoked. He has never used smokeless tobacco. He reports current alcohol use. He reports previous drug use.  Physical Exam: BP 115/76   Pulse 83   Ht 5\' 11"  (1.803 m)   Wt 292 lb (132.5 kg)   BMI 40.73 kg/m    Constitutional:  Alert and oriented, No acute distress. Cardiovascular: Regular rate and rhythm Respiratory: Clear to auscultation bilaterally GI: Abdomen is soft, nontender, nondistended, no abdominal masses  Laboratory Data: Reviewed, see HPI  Pertinent Imaging: I have personally viewed and interpreted the CT stone protocol dated 08/23/2020 that shows a 6 mm left proximal ureteral stone with upstream hydronephrosis. 350 HU, SSD 18cm, suspect uric acid stone.  No other renal stones.  Stone not visible  on KUB today(uric acid).  Assessment & Plan:   55 year old male with 6 mm left proximal ureteral stone, likely uric acid based on stone density and urine pH.  No laboratory or clinical evidence of infection.  We discussed various treatment options for urolithiasis including observation with or without medical expulsive therapy, shockwave lithotripsy (SWL), ureteroscopy and laser lithotripsy with stent placement, and percutaneous nephrolithotomy.  We discussed that management is based on stone size, location, density, patient co-morbidities, and patient preference.   Stones <68mm in size have a >80% spontaneous passage rate. Data surrounding the use of tamsulosin for medical expulsive therapy is controversial, but meta analyses suggests it is most efficacious for distal stones between 5-36mm in size. Possible side effects include dizziness/lightheadedness, and retrograde ejaculation.  SWL has a lower stone free rate in a single procedure, but also a lower complication rate compared to ureteroscopy and avoids a stent and associated stent related symptoms. Possible complications include renal hematoma, steinstrasse, and need for additional treatment.  Shockwave lithotripsy is not an option for him with his uric acid stone and not visible on KUB, in addition to significant skin to stone distance.  Ureteroscopy with laser lithotripsy and stent placement has a higher stone free rate than SWL in a single procedure, however increased complication rate including possible infection, ureteral injury, bleeding, and stent related morbidity. Common stent related symptoms include dysuria, urgency/frequency, and flank pain.  We discussed an additional week of medical expulsive therapy, versus adding to the OR this week for left ureteroscopy, laser lithotripsy,  stent placement.  With his ongoing symptoms over the last few weeks and larger stone, he would like to pursue ureteroscopy this week which is very  reasonable.  Schedule left ureteroscopy, laser lithotripsy, stent placement tomorrow   Legrand Rams, MD 09/03/2020  [facility] [address], [address] [place], [place] [address] [phone]

## 2020-09-03 NOTE — Patient Instructions (Signed)
Laser Therapy for Kidney Stones Laser therapy for kidney stones is a procedure to break up small, hard mineral deposits that form in the kidney (kidney stones). The procedure is done using a device that produces a focused beam of light (laser). The laser breaks up kidney stones into pieces that are small enough to be passed out of the body through urination or removed from the body during the procedure. You may need laser therapy if you have kidney stones that are painful or block your urinary tract. This procedure is done by inserting a tube (ureteroscope) into your kidney through the urethral opening. The urethra is the part of the body that drains urine from the bladder. In women, the urethra opens above the vaginal opening. In men, the urethra opens at the tip of the penis. The ureteroscope is inserted through the urethra, and surgical instruments are moved through the bladder and the muscular tube that connects the kidney to the bladder (ureter) until they reach the kidney. Tell a health care provider about:  Any allergies you have.  All medicines you are taking, including vitamins, herbs, eye drops, creams, and over-the-counter medicines.  Any problems you or family members have had with anesthetic medicines.  Any blood disorders you have.  Any surgeries you have had.  Any medical conditions you have.  Whether you are pregnant or may be pregnant. What are the risks? Generally, this is a safe procedure. However, problems may occur, including:  Infection.  Bleeding.  Allergic reactions to medicines.  Damage to the urethra, bladder, or ureter.  Urinary tract infection (UTI).  Narrowing of the urethra (urethral stricture).  Difficulty passing urine.  Blockage of the kidney caused by a fragment of kidney stone. What happens before the procedure? Medicines  Ask your health care provider about: ? Changing or stopping your regular medicines. This is especially important if you  are taking diabetes medicines or blood thinners. ? Taking medicines such as aspirin and ibuprofen. These medicines can thin your blood. Do not take these medicines unless your health care provider tells you to take them. ? Taking over-the-counter medicines, vitamins, herbs, and supplements. Eating and drinking Follow instructions from your health care provider about eating and drinking, which may include:  8 hours before the procedure - stop eating heavy meals or foods, such as meat, fried foods, or fatty foods.  6 hours before the procedure - stop eating light meals or foods, such as toast or cereal.  6 hours before the procedure - stop drinking milk or drinks that contain milk.  2 hours before the procedure - stop drinking clear liquids. Staying hydrated Follow instructions from your health care provider about hydration, which may include:  Up to 2 hours before the procedure - you may continue to drink clear liquids, such as water, clear fruit juice, black coffee, and plain tea.   General instructions  You may have a physical exam before the procedure. You may also have tests, such as imaging tests and blood or urine tests.  If your ureter is too narrow, your health care provider may place a soft, flexible tube (stent) inside of it. The stent may be placed days or weeks before your laser therapy procedure.  Plan to have someone take you home from the hospital or clinic.  If you will be going home right after the procedure, plan to have someone stay with you for 24 hours.  Do not use any products that contain nicotine or tobacco for at least   4 weeks before the procedure. These products include cigarettes, e-cigarettes, and chewing tobacco. If you need help quitting, ask your health care provider.  Ask your health care provider: ? How your surgical site will be marked or identified. ? What steps will be taken to help prevent infection. These may include:  Removing hair at the surgery  site.  Washing skin with a germ-killing soap.  Taking antibiotic medicine. What happens during the procedure?  An IV will be inserted into one of your veins.  You will be given one or more of the following: ? A medicine to help you relax (sedative). ? A medicine to numb the area (local anesthetic). ? A medicine to make you fall asleep (general anesthetic).  A ureteroscope will be inserted into your urethra. The ureteroscope will send images to a video screen in the operating room to guide your surgeon to the area of your kidney that will be treated.  A small, flexible tube will be threaded through the ureteroscope and into your bladder and ureter, up to your kidney.  The laser device will be inserted into your kidney through the tube. Your surgeon will pulse the laser on and off to break up kidney stones.  A surgical instrument that has a tiny wire basket may be inserted through the tube into your kidney to remove the pieces of broken kidney stone. The procedure may vary among health care providers and hospitals.   What happens after the procedure?  Your blood pressure, heart rate, breathing rate, and blood oxygen level will be monitored until you leave the hospital or clinic.  You will be given pain medicine as needed.  You may continue to receive antibiotics.  You may have a stent temporarily placed in your ureter.  Do not drive for 24 hours if you were given a sedative during your procedure.  You may be given a strainer to collect any stone fragments that you pass in your urine. Your health care provider may have these tested. Summary  Laser therapy for kidney stones is a procedure to break up kidney stones into pieces that are small enough to be passed out of the body through urination or removed during the procedure.  Follow instructions from your health care provider about eating and drinking before the procedure.  During the procedure, the ureteroscope will send images  to a video screen to guide your surgeon to the area of your kidney that will be treated.  Do not drive for 24 hours if you were given a sedative during your procedure. This information is not intended to replace advice given to you by your health care provider. Make sure you discuss any questions you have with your health care provider. Document Revised: 02/17/2018 Document Reviewed: 02/17/2018 Elsevier Patient Education  2021 [employer].   Ureteral Stent Implantation  Ureteral stent implantation is a procedure to insert (implant) a flexible, soft, plastic tube (stent) into a ureter. Ureters are the tube-like parts of the body that drain urine from the kidneys. The stent supports the ureter while it heals and helps to drain urine. You may have a ureteral stent implanted after having a procedure to remove a blockage from the ureter (ureterolysis or pyeloplasty). You may also have a stent implanted to open the flow of urine when you have a blockage caused by a kidney stone, tumor, blood clot, or infection. You have two ureters, one on each side of the body. The ureters connect the kidneys to the organ that   holds urine until it passes out of the body (bladder). The stent is placed so that one end is in the kidney, and one end is in the bladder. The stent is usually taken out after your ureter has healed. Depending on your condition, you may have a stent for just a few weeks, or you may have a long-term stent that will need to be replaced every few months. Tell a health care provider about:  Any allergies you have.  All medicines you are taking, including vitamins, herbs, eye drops, creams, and over-the-counter medicines.  Any problems you or family members have had with anesthetic medicines.  Any blood disorders you have.  Any surgeries you have had.  Any medical conditions you have.  Whether you are pregnant or may be pregnant. What are the risks? Generally, this is a safe procedure.  However, problems may occur, including:  Infection.  Bleeding.  Allergic reactions to medicines.  Damage to other structures or organs. Tearing (perforation) of the ureter is possible.  Movement of the stent away from where it is placed during surgery (migration). What happens before the procedure? Medicines Ask your health care provider about:  Changing or stopping your regular medicines. This is especially important if you are taking diabetes medicines or blood thinners.  Taking medicines such as aspirin and ibuprofen. These medicines can thin your blood. Do not take these medicines unless your health care provider tells you to take them.  Taking over-the-counter medicines, vitamins, herbs, and supplements. Eating and drinking Follow instructions from your health care provider about eating and drinking, which may include:  8 hours before the procedure - stop eating heavy meals or foods, such as meat, fried foods, or fatty foods.  6 hours before the procedure - stop eating light meals or foods, such as toast or cereal.  6 hours before the procedure - stop drinking milk or drinks that contain milk.  2 hours before the procedure - stop drinking clear liquids. Staying hydrated Follow instructions from your health care provider about hydration, which may include:  Up to 2 hours before the procedure - you may continue to drink clear liquids, such as water, clear fruit juice, black coffee, and plain tea. General instructions  Do not drink alcohol.  Do not use any products that contain nicotine or tobacco for at least 4 weeks before the procedure. These products include cigarettes, e-cigarettes, and chewing tobacco. If you need help quitting, ask your health care provider.  You may have an exam or testing, such as imaging or blood tests.  Ask your health care provider what steps will be taken to help prevent infection. These may include: ? Removing hair at the surgery  site. ? Washing skin with a germ-killing soap. ? Taking antibiotic medicine.  Plan to have someone take you home from the hospital or clinic.  If you will be going home right after the procedure, plan to have someone with you for 24 hours. What happens during the procedure?  An IV will be inserted into one of your veins.  You may be given a medicine to help you relax (sedative).  You may be given a medicine to make you fall asleep (general anesthetic).  A thin, tube-shaped instrument with a light and tiny camera at the end (cystoscope) will be inserted into your urethra. The urethra is the tube that drains urine from the bladder out of the body. In men, the urethra opens at the end of the penis. In women, the   urethra opens in front of the vaginal opening.  The cystoscope will be passed into your bladder.  A thin wire (guide wire) will be passed through your bladder and into your ureter. This is used to guide the stent into your ureter.  The stent will be inserted into your ureter.  The guide wire and the cystoscope will be removed.  A flexible tube (catheter) may be inserted through your urethra so that one end is in your bladder. This helps to drain urine from your bladder. The procedure may vary among hospitals and health care providers. What happens after the procedure?  Your blood pressure, heart rate, breathing rate, and blood oxygen level will be monitored until you leave the hospital or clinic.  You may continue to receive medicine and fluids through an IV.  You may have some soreness or pain in your abdomen and urethra. Medicines will be available to help you.  You will be encouraged to get up and walk around as soon as you can.  You may have a catheter draining your urine.  You will have some blood in your urine.  Do not drive for 24 hours if you were given a sedative during your procedure. Summary  Ureteral stent implantation is a procedure to insert a flexible,  soft, plastic tube (stent) into a ureter.  You may have a stent implanted to support the ureter while it heals after a procedure or to open the flow of urine if there is a blockage.  Follow instructions from your health care provider about taking medicines and about eating and drinking before the procedure.  Depending on your condition, you may have a stent for just a few weeks, or you may have a long-term stent that will need to be replaced every few months. This information is not intended to replace advice given to you by your health care provider. Make sure you discuss any questions you have with your health care provider. Document Revised: 03/15/2018 Document Reviewed: 03/16/2018 Elsevier Patient Education  2021 [employer]vier Inc.  

## 2020-09-04 ENCOUNTER — Ambulatory Visit: Payer: 59 | Admitting: Certified Registered"

## 2020-09-04 ENCOUNTER — Encounter: Payer: Self-pay | Admitting: Urology

## 2020-09-04 ENCOUNTER — Ambulatory Visit: Payer: 59

## 2020-09-04 ENCOUNTER — Ambulatory Visit
Admission: RE | Admit: 2020-09-04 | Discharge: 2020-09-04 | Disposition: A | Discharge status: Home / Self Care | Payer: 59 | Attending: Urology | Admitting: Urology

## 2020-09-04 ENCOUNTER — Encounter
Admission: RE | Disposition: A | Discharge status: Home / Self Care | Payer: Self-pay | Source: Home / Self Care | Attending: Urology

## 2020-09-04 DIAGNOSIS — N201 Calculus of ureter: Secondary | ICD-10-CM

## 2020-09-04 DIAGNOSIS — N132 Hydronephrosis with renal and ureteral calculous obstruction: Secondary | ICD-10-CM | POA: Insufficient documentation

## 2020-09-04 DIAGNOSIS — E119 Type 2 diabetes mellitus without complications: Secondary | ICD-10-CM | POA: Insufficient documentation

## 2020-09-04 DIAGNOSIS — Z6841 Body Mass Index (BMI) 40.0 and over, adult: Secondary | ICD-10-CM | POA: Diagnosis not present

## 2020-09-04 DIAGNOSIS — Z466 Encounter for fitting and adjustment of urinary device: Secondary | ICD-10-CM | POA: Diagnosis not present

## 2020-09-04 DIAGNOSIS — I10 Essential (primary) hypertension: Secondary | ICD-10-CM | POA: Insufficient documentation

## 2020-09-04 HISTORY — PX: CYSTOSCOPY/URETEROSCOPY/HOLMIUM LASER/STENT PLACEMENT: SHX6546

## 2020-09-04 LAB — GLUCOSE, CAPILLARY
Glucose-Capillary: 162 mg/dL — ABNORMAL HIGH (ref 70–99)
Glucose-Capillary: 136 mg/dL — ABNORMAL HIGH (ref 70–99)

## 2020-09-04 SURGERY — CYSTOSCOPY/URETEROSCOPY/HOLMIUM LASER/STENT PLACEMENT
Anesthesia: General | Site: Ureter | Laterality: Left

## 2020-09-04 MED ORDER — CHLORHEXIDINE GLUCONATE 0.12 % MT SOLN
15.0000 mL | Freq: Once | OROMUCOSAL | Status: AC
  Administered 2020-09-04: 15 mL via OROMUCOSAL

## 2020-09-04 MED ORDER — SULFAMETHOXAZOLE-TRIMETHOPRIM 800-160 MG PO TABS: 1.0000 | ORAL_TABLET | Freq: Every day | ORAL | 0 refills | Status: DC

## 2020-09-04 MED ORDER — LIDOCAINE HCL (PF) 2 % IJ SOLN
INTRAMUSCULAR | Status: AC
  Filled 2020-09-04: qty 5

## 2020-09-04 MED ORDER — MIDAZOLAM HCL 2 MG/2ML IJ SOLN
INTRAMUSCULAR | Status: AC
  Filled 2020-09-04: qty 2

## 2020-09-04 MED ORDER — FENTANYL CITRATE (PF) 100 MCG/2ML IJ SOLN
25.0000 ug | INTRAMUSCULAR | Status: DC | PRN
Start: 2020-09-04 — End: 2020-09-04
  Administered 2020-09-04 (×3): 25 ug via INTRAVENOUS

## 2020-09-04 MED ORDER — ONDANSETRON HCL 4 MG/2ML IJ SOLN
INTRAMUSCULAR | Status: DC | PRN
  Administered 2020-09-04: 4 mg via INTRAVENOUS

## 2020-09-04 MED ORDER — CHLORHEXIDINE GLUCONATE 0.12 % MT SOLN
OROMUCOSAL | Status: AC
  Filled 2020-09-04: qty 15

## 2020-09-04 MED ORDER — FENTANYL CITRATE (PF) 100 MCG/2ML IJ SOLN
INTRAMUSCULAR | Status: AC
  Administered 2020-09-04: 25 ug via INTRAVENOUS
  Filled 2020-09-04: qty 2

## 2020-09-04 MED ORDER — LIDOCAINE HCL (CARDIAC) PF 100 MG/5ML IV SOSY
PREFILLED_SYRINGE | INTRAVENOUS | Status: DC | PRN
  Administered 2020-09-04: 100 mg via INTRAVENOUS

## 2020-09-04 MED ORDER — ONDANSETRON HCL 4 MG/2ML IJ SOLN: 4.0000 mg | Freq: Once | INTRAMUSCULAR | Status: DC | PRN

## 2020-09-04 MED ORDER — FENTANYL CITRATE (PF) 100 MCG/2ML IJ SOLN
INTRAMUSCULAR | Status: DC | PRN
  Administered 2020-09-04: 50 ug via INTRAVENOUS

## 2020-09-04 MED ORDER — CEFAZOLIN SODIUM-DEXTROSE 1-4 GM/50ML-% IV SOLN
INTRAVENOUS | Status: DC | PRN
  Administered 2020-09-04: 1 g via INTRAVENOUS

## 2020-09-04 MED ORDER — SUCCINYLCHOLINE CHLORIDE 200 MG/10ML IV SOSY
PREFILLED_SYRINGE | INTRAVENOUS | Status: AC
  Filled 2020-09-04: qty 10

## 2020-09-04 MED ORDER — SUCCINYLCHOLINE CHLORIDE 20 MG/ML IJ SOLN
INTRAMUSCULAR | Status: DC | PRN
  Administered 2020-09-04: 140 mg via INTRAVENOUS

## 2020-09-04 MED ORDER — KETOROLAC TROMETHAMINE 30 MG/ML IJ SOLN
INTRAMUSCULAR | Status: DC | PRN
  Administered 2020-09-04: 15 mg via INTRAVENOUS

## 2020-09-04 MED ORDER — PROPOFOL 10 MG/ML IV BOLUS
INTRAVENOUS | Status: DC | PRN
  Administered 2020-09-04: 170 mg via INTRAVENOUS

## 2020-09-04 MED ORDER — ROCURONIUM BROMIDE 100 MG/10ML IV SOLN
INTRAVENOUS | Status: DC | PRN
  Administered 2020-09-04: 10 mg via INTRAVENOUS
  Administered 2020-09-04: 40 mg via INTRAVENOUS

## 2020-09-04 MED ORDER — ORAL CARE MOUTH RINSE: 15.0000 mL | Freq: Once | OROMUCOSAL | Status: AC

## 2020-09-04 MED ORDER — FENTANYL CITRATE (PF) 100 MCG/2ML IJ SOLN
INTRAMUSCULAR | Status: AC
  Filled 2020-09-04: qty 2

## 2020-09-04 MED ORDER — KETOROLAC TROMETHAMINE 30 MG/ML IJ SOLN
INTRAMUSCULAR | Status: AC
  Filled 2020-09-04: qty 1

## 2020-09-04 MED ORDER — ACETAMINOPHEN 10 MG/ML IV SOLN
INTRAVENOUS | Status: DC | PRN
  Administered 2020-09-04: 1000 mg via INTRAVENOUS

## 2020-09-04 MED ORDER — SUGAMMADEX SODIUM 500 MG/5ML IV SOLN
INTRAVENOUS | Status: AC
  Filled 2020-09-04: qty 5

## 2020-09-04 MED ORDER — MIDAZOLAM HCL 2 MG/2ML IJ SOLN
INTRAMUSCULAR | Status: DC | PRN
  Administered 2020-09-04: 2 mg via INTRAVENOUS

## 2020-09-04 MED ORDER — IOHEXOL 180 MG/ML  SOLN
INTRAMUSCULAR | Status: DC | PRN
  Administered 2020-09-04: 5 mL

## 2020-09-04 MED ORDER — CEFAZOLIN SODIUM-DEXTROSE 2-4 GM/100ML-% IV SOLN
INTRAVENOUS | Status: AC
  Filled 2020-09-04: qty 100

## 2020-09-04 MED ORDER — PROPOFOL 10 MG/ML IV BOLUS
INTRAVENOUS | Status: AC
  Filled 2020-09-04: qty 20

## 2020-09-04 MED ORDER — BELLADONNA ALKALOIDS-OPIUM 16.2-60 MG RE SUPP
RECTAL | Status: DC | PRN
  Administered 2020-09-04: 1 via RECTAL

## 2020-09-04 MED ORDER — SUGAMMADEX SODIUM 500 MG/5ML IV SOLN
INTRAVENOUS | Status: DC | PRN
  Administered 2020-09-04: 300 mg via INTRAVENOUS

## 2020-09-04 MED ORDER — OXYCODONE HCL 5 MG PO TABS: 5.0000 mg | ORAL_TABLET | Freq: Three times a day (TID) | ORAL | 0 refills | Status: AC | PRN

## 2020-09-04 MED ORDER — ROCURONIUM BROMIDE 10 MG/ML (PF) SYRINGE
PREFILLED_SYRINGE | INTRAVENOUS | Status: AC
  Filled 2020-09-04: qty 10

## 2020-09-04 MED ORDER — SODIUM CHLORIDE 0.9 % IV SOLN: INTRAVENOUS | Status: DC

## 2020-09-04 MED ORDER — ACETAMINOPHEN 10 MG/ML IV SOLN
INTRAVENOUS | Status: AC
  Filled 2020-09-04: qty 100

## 2020-09-04 MED ORDER — BELLADONNA ALKALOIDS-OPIUM 16.2-60 MG RE SUPP
RECTAL | Status: AC
  Filled 2020-09-04: qty 1

## 2020-09-04 SURGICAL SUPPLY — 34 items
BAG DRAIN CYSTO-URO LG1000N (MISCELLANEOUS) ×2 IMPLANT
BRUSH SCRUB EZ 1% IODOPHOR (MISCELLANEOUS) ×2 IMPLANT
CATH URET FLEX-TIP 2 LUMEN 10F (CATHETERS) IMPLANT
CATH URETL 5X70 OPEN END (CATHETERS) IMPLANT
CNTNR SPEC 2.5X3XGRAD LEK (MISCELLANEOUS)
CONT SPEC 4OZ STER OR WHT (MISCELLANEOUS)
CONT SPEC 4OZ STRL OR WHT (MISCELLANEOUS)
CONTAINER SPEC 2.5X3XGRAD LEK (MISCELLANEOUS) IMPLANT
DRAPE UTILITY 15X26 TOWEL STRL (DRAPES) IMPLANT
DRSG TEGADERM 2-3/8X2-3/4 SM (GAUZE/BANDAGES/DRESSINGS) ×2 IMPLANT
GLOVE SURG UNDER POLY LF SZ7.5 (GLOVE) ×2 IMPLANT
GOWN STRL REUS W/ TWL LRG LVL3 (GOWN DISPOSABLE) ×1 IMPLANT
GOWN STRL REUS W/ TWL XL LVL3 (GOWN DISPOSABLE) ×1 IMPLANT
GOWN STRL REUS W/TWL LRG LVL3 (GOWN DISPOSABLE) ×2
GOWN STRL REUS W/TWL XL LVL3 (GOWN DISPOSABLE) ×2
GUIDEWIRE GREEN .038 145CM (MISCELLANEOUS) ×2 IMPLANT
GUIDEWIRE STR DUAL SENSOR (WIRE) IMPLANT
INFUSOR MANOMETER BAG 3000ML (MISCELLANEOUS) ×2 IMPLANT
KIT TURNOVER CYSTO (KITS) ×2 IMPLANT
PACK CYSTO AR (MISCELLANEOUS) ×2 IMPLANT
SET CYSTO W/LG BORE CLAMP LF (SET/KITS/TRAYS/PACK) ×2 IMPLANT
SHEATH URETERAL 12FRX35CM (MISCELLANEOUS) IMPLANT
SLEEVE GASTRECTOMY 36FR VISIGI (MISCELLANEOUS) ×2 IMPLANT
SOL .9 NS 3000ML IRR  AL (IV SOLUTION) ×1
SOL .9 NS 3000ML IRR AL (IV SOLUTION) ×1
SOL .9 NS 3000ML IRR UROMATIC (IV SOLUTION) ×1 IMPLANT
STENT URET 6FRX24 CONTOUR (STENTS) IMPLANT
STENT URET 6FRX26 CONTOUR (STENTS) IMPLANT
STENT URET 6FRX28 CONTOUR (STENTS) ×2 IMPLANT
SURGILUBE 2OZ TUBE FLIPTOP (MISCELLANEOUS) ×2 IMPLANT
SYR 10ML LL (SYRINGE) ×2 IMPLANT
TRACTIP FLEXIVA PULSE ID 200 (Laser) ×2 IMPLANT
VALVE UROSEAL ADJ ENDO (VALVE) IMPLANT
WATER STERILE IRR 1000ML POUR (IV SOLUTION) IMPLANT

## 2020-09-04 NOTE — Anesthesia Preprocedure Evaluation (Signed)
Anesthesia Evaluation  Patient identified by MRN, date of birth, ID band Patient awake    Reviewed: Allergy & Precautions, NPO status , Patient's Chart, lab work & pertinent test results  Airway Mallampati: II  TM Distance: >3 FB Neck ROM: Full   Comment: Large neck Dental  (+) Teeth Intact, Chipped   Pulmonary sleep apnea and Continuous Positive Airway Pressure Ventilation ,    Pulmonary exam normal        Cardiovascular hypertension, Normal cardiovascular exam     Neuro/Psych negative neurological ROS  negative psych ROS   GI/Hepatic negative GI ROS, Neg liver ROS,   Endo/Other  diabetesMorbid obesity  Renal/GU   negative genitourinary   Musculoskeletal negative musculoskeletal ROS (+)   Abdominal Normal abdominal exam  (+)   Peds negative pediatric ROS (+)  Hematology negative hematology ROS (+)   Anesthesia Other Findings   Reproductive/Obstetrics                             Anesthesia Physical Anesthesia Plan  ASA: III  Anesthesia Plan: General   Post-op Pain Management:    Induction: Intravenous  PONV Risk Score and Plan:   Airway Management Planned: Video Laryngoscope Planned and Oral ETT  Additional Equipment:   Intra-op Plan:   Post-operative Plan: Extubation in OR  Informed Consent: I have reviewed the patients History and Physical, chart, labs and discussed the procedure including the risks, benefits and alternatives for the proposed anesthesia with the patient or authorized representative who has indicated his/her understanding and acceptance.     Dental advisory given  Plan Discussed with: CRNA and Surgeon  Anesthesia Plan Comments:         Anesthesia Quick Evaluation

## 2020-09-04 NOTE — Op Note (Signed)
Date of procedure: 09/04/20  Preoperative diagnosis:  1. Left proximal ureteral stone  Postoperative diagnosis:  1. Same  Procedure: 1. Cystoscopy, left ureteroscopy, laser lithotripsy, left retrograde pyelogram with intraoperative interpretation, left ureteral stent placement  Surgeon: Nickolas Madrid, MD  Anesthesia: General  Complications: None  Intraoperative findings:  1.  Moderate size prostate, normal bladder 2.  6 mm left proximal ureteral stone dusted, no additional stones.  Suspect uric acid stones 3.  Uncomplicated stent placement with Dangler  EBL: Minimal  Specimens: None  Drains: Left 6 French by 28 cm ureteral stent  Indication: Ricardo Nelson is a 55 y.o. patient with left-sided flank pain and a 6 mm left proximal ureteral stone on CT.  This appeared to be uric acid based on the density and acidic urine pH.  After reviewing the management options for treatment, they elected to proceed with the above surgical procedure(s). We have discussed the potential benefits and risks of the procedure, side effects of the proposed treatment, the likelihood of the patient achieving the goals of the procedure, and any potential problems that might occur during the procedure or recuperation. Informed consent has been obtained.  Description of procedure:  The patient was taken to the operating room and general anesthesia was induced. SCDs were placed for DVT prophylaxis. The patient was placed in the dorsal lithotomy position, prepped and draped in the usual sterile fashion, and preoperative antibiotics(Ancef) were administered. A preoperative time-out was performed.   A 21 French rigid cystoscope was used to intubate the urethra and a normal-appearing urethra was followed proximally into the bladder.  The prostate was moderate in size with no median lobe.  The bladder was grossly normal.  A sensor wire advanced easily into the left ureteral orifice and met some subtle resistance at  the proximal ureter consistent with his known stone.  I attempted to advance the single channel digital ureteroscope over the wire but met resistance at the ureteral orifice.  A dual-lumen ureteral access catheter was used for gentle dilation, and a second Super Stiff wire was added into the kidney.  Was able to advance the flexible ureteroscope over the Super Stiff wire up into the kidney.  Thorough pyeloscopy revealed a 6 mm yellow uric acid appearing stone in the renal pelvis that had been pushed up from the proximal ureter.  A 242 m laser fiber on settings of 0.5 J and 40 Hz was used to methodically dust the stone to <1 mm fragments.  Thorough pyeloscopy revealed no other abnormalities within the kidney.    Contrast was injected from the proximal ureter for a retrograde pyelogram that showed no extravasation or filling defects.  Careful pullback ureteroscopy demonstrated no ureteral abnormalities or residual fragments.  The rigid cystoscope was backloaded over the wire and a 6 [place] by 28 cm stent with Dangler attached was uneventfully placed with a curl in the upper pole, as well as under direct vision in the bladder.  The bladder was drained and the Dangler was secured to the penis using Mastisol and Tegaderm.  A belladonna suppository was placed.   Disposition: Stable to PACU  Plan: Bactrim prophylaxis while stent in place, can remove stent Monday morning at home Follow-up in 1 month to discuss adding potassium citrate for recurrent uric acid stone prevention  Nickolas Madrid, MD

## 2020-09-04 NOTE — Anesthesia Procedure Notes (Signed)
Procedure Name: Intubation Date/Time: 09/04/2020 11:51 AM Performed by: Karoline Caldwell, CRNA Pre-anesthesia Checklist: Patient identified, Patient being monitored, Timeout performed, Emergency Drugs available and Suction available Patient Re-evaluated:Patient Re-evaluated prior to induction Oxygen Delivery Method: Circle system utilized Preoxygenation: Pre-oxygenation with 100% oxygen Induction Type: IV induction Ventilation: Mask ventilation without difficulty and Oral airway inserted - appropriate to patient size Laryngoscope Size: 4 and McGraph Grade View: Grade I Tube type: Oral Tube size: 7.5 mm Number of attempts: 1 Airway Equipment and Method: Stylet Placement Confirmation: ETT inserted through vocal cords under direct vision,  positive ETCO2 and breath sounds checked- equal and bilateral Secured at: 22 cm Tube secured with: Tape Dental Injury: Teeth and Oropharynx as per pre-operative assessment

## 2020-09-04 NOTE — Discharge Instructions (Signed)
AMBULATORY SURGERY  °DISCHARGE INSTRUCTIONS ° ° °1) The drugs that you were given will stay in your system until tomorrow so for the next 24 hours you should not: ° °A) Drive an automobile °B) Make any legal decisions °C) Drink any alcoholic beverage ° ° °2) You may resume regular meals tomorrow.  Today it is better to start with liquids and gradually work up to solid foods. ° °You may eat anything you prefer, but it is better to start with liquids, then soup and crackers, and gradually work up to solid foods. ° ° °3) Please notify your doctor immediately if you have any unusual bleeding, trouble breathing, redness and pain at the surgery site, drainage, fever, or pain not relieved by medication. ° ° ° °4) Additional Instructions: ° ° ° ° ° ° ° °Please contact your physician with any problems or Same Day Surgery at [phone], Monday through Friday 6 am to 4 pm, or [place] [facility] [place] [facility] number at [phone]. °

## 2020-09-04 NOTE — Interval H&P Note (Signed)
UROLOGY H&P UPDATE  Agree with prior H&P dated 09/03/20.  6 mm left proximal ureteral stone with persistent flank pain and microscopic hematuria.  Stone not seen on KUB because uric acid.  Cardiac: RRR Lungs: CTA bilaterally  Laterality: Left Procedure: Left ureteroscopy, laser lithotripsy, stent placement  We specifically discussed the risks ureteroscopy including bleeding, infection/sepsis, stent related symptoms including flank pain/urgency/frequency/incontinence/dysuria, ureteral injury, inability to access stone, or need for staged or additional procedures.   Sondra Come, MD 09/04/2020

## 2020-09-04 NOTE — Transfer of Care (Signed)
Immediate Anesthesia Transfer of Care Note  Patient: Ricardo Nelson  Procedure(s) Performed: CYSTOSCOPY/URETEROSCOPY/HOLMIUM LASER/STENT PLACEMENT (Left Ureter)  Patient Location: PACU  Anesthesia Type:General  Level of Consciousness: drowsy and patient cooperative  Airway & Oxygen Therapy: Patient Spontanous Breathing and Patient connected to face mask oxygen  Post-op Assessment: Report given to RN and Post -op Vital signs reviewed and stable  Post vital signs: Reviewed and stable  Last Vitals:  Vitals Value Taken Time  BP 133/87 09/04/20 1235  Temp    Pulse 93 09/04/20 1237  Resp 11 09/04/20 1236  SpO2 100 % 09/04/20 1237  Vitals shown include unvalidated device data.  Last Pain:  Vitals:   09/04/20 0856  PainSc: 0-No pain         Complications: No complications documented.

## 2020-09-05 ENCOUNTER — Other Ambulatory Visit: Payer: Self-pay

## 2020-09-05 ENCOUNTER — Encounter: Payer: Self-pay | Admitting: Urology

## 2020-09-05 MED ORDER — TAMSULOSIN HCL 0.4 MG PO CAPS: 0.4000 mg | ORAL_CAPSULE | Freq: Every day | ORAL | 0 refills | Status: DC

## 2020-09-05 NOTE — Anesthesia Postprocedure Evaluation (Signed)
Anesthesia Post Note  Patient: Ricardo Nelson  Procedure(s) Performed: CYSTOSCOPY/URETEROSCOPY/HOLMIUM LASER/STENT PLACEMENT (Left Ureter)  Patient location during evaluation: PACU Anesthesia Type: General Level of consciousness: awake and alert and oriented Pain management: pain level controlled Vital Signs Assessment: post-procedure vital signs reviewed and stable Respiratory status: spontaneous breathing Cardiovascular status: blood pressure returned to baseline Anesthetic complications: no   No complications documented.   Last Vitals:  Vitals:   09/04/20 1315 09/04/20 1328  BP: 111/72 117/75  Pulse: 70 68  Resp: 12 18  Temp: 36.7 C 36.5 C  SpO2: 97% 99%    Last Pain:  Vitals:   09/05/20 1041  TempSrc:   PainSc: 2                  Kimberlin Scheel

## 2020-11-22 ENCOUNTER — Other Ambulatory Visit: Payer: Self-pay | Admitting: Nurse Practitioner

## 2020-11-22 DIAGNOSIS — E7800 Pure hypercholesterolemia, unspecified: Secondary | ICD-10-CM

## 2020-11-22 DIAGNOSIS — I10 Essential (primary) hypertension: Secondary | ICD-10-CM

## 2020-11-22 DIAGNOSIS — E119 Type 2 diabetes mellitus without complications: Secondary | ICD-10-CM

## 2020-11-22 MED ORDER — ATORVASTATIN CALCIUM 20 MG PO TABS
20.0000 mg | ORAL_TABLET | Freq: Every day | ORAL | 2 refills | Status: DC
Start: 2020-11-22 — End: 2022-01-23

## 2020-11-22 MED ORDER — LISINOPRIL-HYDROCHLOROTHIAZIDE 20-12.5 MG PO TABS: 2.0000 | ORAL_TABLET | Freq: Every day | ORAL | 2 refills | Status: DC

## 2020-11-27 ENCOUNTER — Ambulatory Visit: Payer: 59

## 2020-12-02 ENCOUNTER — Other Ambulatory Visit: Payer: Self-pay

## 2020-12-02 ENCOUNTER — Encounter: Payer: Self-pay | Admitting: Adult Health

## 2020-12-02 ENCOUNTER — Ambulatory Visit: Payer: Self-pay | Admitting: Adult Health

## 2020-12-02 VITALS — BP 112/74 | HR 85 | Temp 97.6°F | Resp 14 | Ht 71.0 in | Wt 301.0 lb

## 2020-12-02 DIAGNOSIS — E119 Type 2 diabetes mellitus without complications: Secondary | ICD-10-CM

## 2020-12-02 LAB — POCT GLYCOSYLATED HEMOGLOBIN (HGB A1C): HbA1c POC (<> result, manual entry): 7.8 % (ref 4.0–5.6)

## 2020-12-02 NOTE — Progress Notes (Signed)
Pt presents today for 3 month follow up. A1c resulted : 7.8

## 2020-12-02 NOTE — Progress Notes (Signed)
[facility] [address] [place], [place] [address]   Office Visit Note  Patient Name: Ricardo Nelson  [id number]  [id number]  Date of Service: 12/02/2020  Chief Complaint  Patient presents with   Follow-up    Pt presents today to follow up      HPI Pt is here for follow up on A1C.  Patients last A1C in March 2022 was 10.0. He was encouraged to take his medications daily and decrease his carbohydrate and sugar intake.  He reports he is essentially eliminated simple sugars.  He does still eat a high amount of carbohydrates mostly in the form of bread.  His A1C is improved today at 7.8.     Current Medication:  Outpatient Encounter Medications as of 12/02/2020  Medication Sig Note   atorvastatin (LIPITOR) 20 MG tablet Take 1 tablet (20 mg total) by mouth at bedtime.    Blood Glucose Monitoring Suppl w/Device KIT Use to check blood sugar as instructed    glucose blood test strip Use as instructed    indomethacin (INDOCIN) 50 MG capsule Take 1 capsule (50 mg total) by mouth 2 (two) times daily as needed (use lowest effective dose, shortest effective duration- take with food when possible). (Patient taking differently: Take 50 mg by mouth 2 (two) times daily as needed (use lowest effective dose, shortest effective duration- take with food when possible/ Gout).)    Lancets (FREESTYLE) lancets Use as instructed    lisinopril-hydrochlorothiazide (ZESTORETIC) 20-12.5 MG tablet Take 2 tablets by mouth daily.    metFORMIN (GLUCOPHAGE) 850 MG tablet TAKE 1 TABLET(850 MG) BY MOUTH TWICE DAILY WITH A MEAL    tamsulosin (FLOMAX) 0.4 MG CAPS capsule Take 0.4 mg by mouth at bedtime.    icosapent Ethyl (VASCEPA) 1 g capsule Take 2 capsules (2 g total) by mouth 2 (two) times daily. 09/03/2020: Have not started   [DISCONTINUED] sulfamethoxazole-trimethoprim (BACTRIM DS) 800-160 MG tablet Take 1 tablet by mouth daily.    [DISCONTINUED] tamsulosin (FLOMAX) 0.4 MG CAPS capsule Take 1 capsule (0.4  mg total) by mouth daily.    No facility-administered encounter medications on file as of 12/02/2020.      Medical History: Past Medical History:  Diagnosis Date   Diabetes mellitus without complication (HCC)    Gout    Hyperlipemia    Hypertension      Vital Signs: BP 112/74   Pulse 85   Temp 97.6 F (36.4 C)   Resp 14   Ht 5' 11"  (1.803 m)   Wt (!) 301 lb (136.5 kg)   SpO2 94%   BMI 41.98 kg/m    Review of Systems  Constitutional:  Negative for activity change and appetite change.  Endocrine: Negative for cold intolerance, heat intolerance and polydipsia.  Psychiatric/Behavioral:  Negative for agitation and behavioral problems.    Physical Exam Constitutional:      Appearance: He is obese.  Skin:    General: Skin is warm and dry.  Neurological:     Mental Status: He is alert and oriented to person, place, and time.  Psychiatric:        Mood and Affect: Mood normal.        Behavior: Behavior normal.     Assessment/Plan: 1. Type 2 diabetes mellitus without complication, without long-term current use of insulin (HCC) Discussed with patient goal A1c of less than 7, pre-prandial blood sugars 80-120, and random blood sugar less than 180. Encouraged patient to keep a log  of blood sugars, and to notify for blood sugars less than 60 or greater than 400.  Also discussed complications of poorly controlled Diabetes.  affects every system in your body, ie.   Brain - dementia/Alzheimers, stroke / paralysis eyes - glaucoma/blindness,  heart - heart attack/heart failure,  kidneys - dialysis,  stomach - gastric paralysis,  intestines - malabsorption,  nerves - severe painful neuritis,  Sex - Erectile Dysfunction and Impotence circulation - gangrene & loss of a leg(s)  and finally Cancer.  Also discussed Adding injectable GLP-1 such as Trulicity to regimen.  He would like to wait till next A1C in 90 days to see how he does with weight loss/ sugar control.   - POCT  glycosylated hemoglobin (Hb A1C)     General Counseling: Marylyn Ishihara verbalizes understanding of the findings of todays visit and agrees with plan of treatment. I have discussed any further diagnostic evaluation that may be needed or ordered today. We also reviewed his medications today. he has been encouraged to call the office with any questions or concerns that should arise related to todays visit.   Orders Placed This Encounter  Procedures   POCT glycosylated hemoglobin (Hb A1C)    No orders of the defined types were placed in this encounter.   Time spent:20 Minutes    Kendell Bane AGNP-C Nurse Practitioner

## 2021-03-03 ENCOUNTER — Other Ambulatory Visit: Payer: 59

## 2021-03-05 ENCOUNTER — Ambulatory Visit: Payer: 59 | Admitting: Physician Assistant

## 2021-05-05 DIAGNOSIS — I208 Other forms of angina pectoris: Secondary | ICD-10-CM | POA: Diagnosis not present

## 2021-05-05 DIAGNOSIS — I10 Essential (primary) hypertension: Secondary | ICD-10-CM | POA: Diagnosis not present

## 2021-05-05 DIAGNOSIS — R0602 Shortness of breath: Secondary | ICD-10-CM | POA: Diagnosis not present

## 2021-05-22 DIAGNOSIS — R0602 Shortness of breath: Secondary | ICD-10-CM | POA: Diagnosis not present

## 2021-05-22 DIAGNOSIS — I208 Other forms of angina pectoris: Secondary | ICD-10-CM | POA: Diagnosis not present

## 2021-05-24 IMAGING — CT CT RENAL STONE PROTOCOL
2 of 5 series · 16 of 46 positions shown, 18 images · non-contrast
Comparison: 04/16/2012

CLINICAL DATA: Left flank pain

EXAM:
CT ABDOMEN AND PELVIS WITHOUT CONTRAST
TECHNIQUE: Multidetector CT imaging of the abdomen and pelvis was performed
following the standard protocol without IV contrast.

[Series 2: stone full standard · axial · 0.98mm/px · z∈[-983,-518]mm · 13 of 105 slices shown, 15 images]
[im 6/105  soft-tissue]
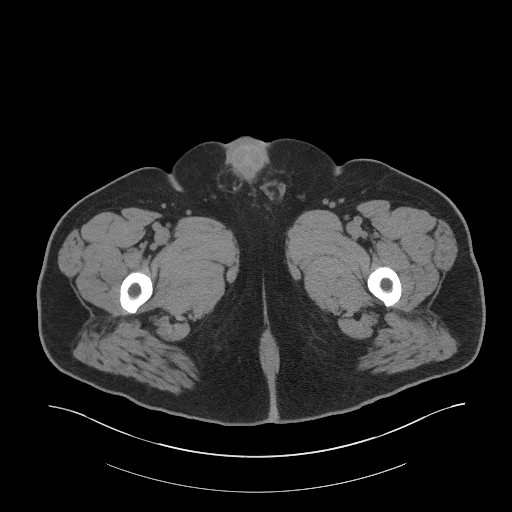
[im 6/105  bone]
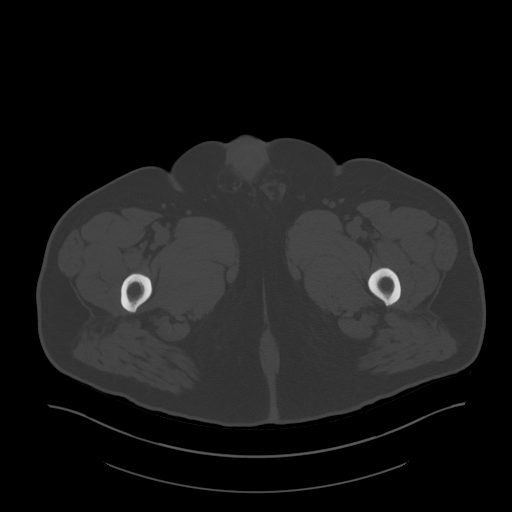
[im 17/105  soft-tissue]
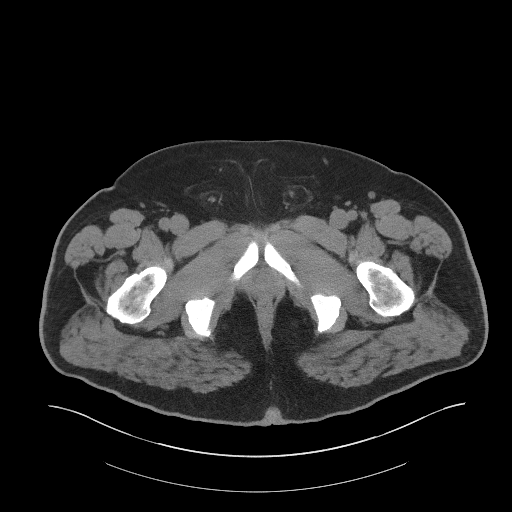
[im 22/105  soft-tissue]
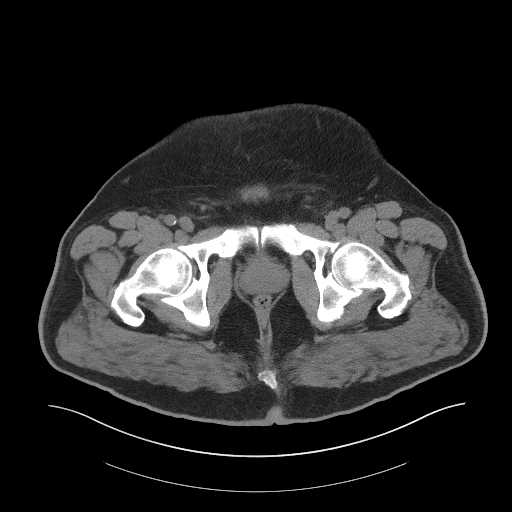
[im 28/105  soft-tissue]
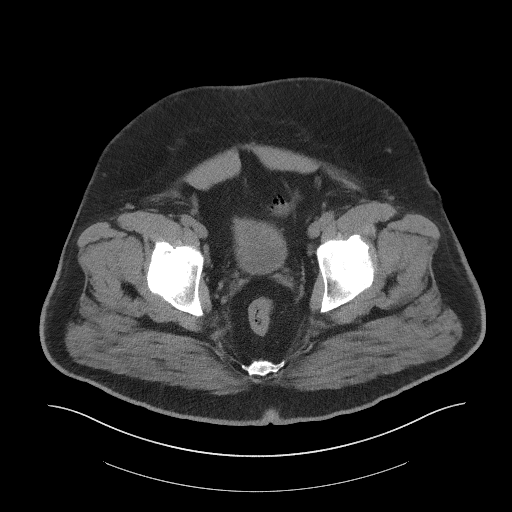
[im 39/105  soft-tissue]
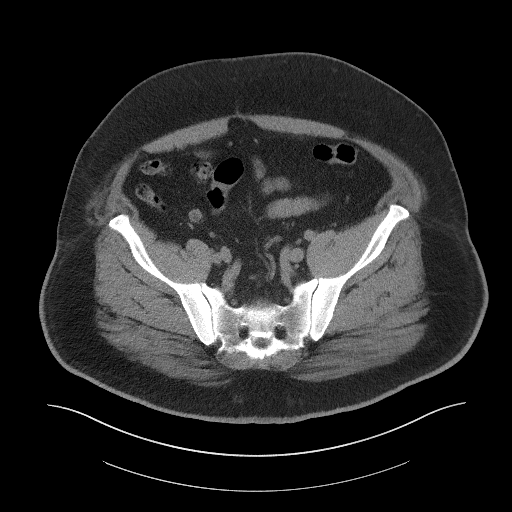
[im 44/105  soft-tissue]
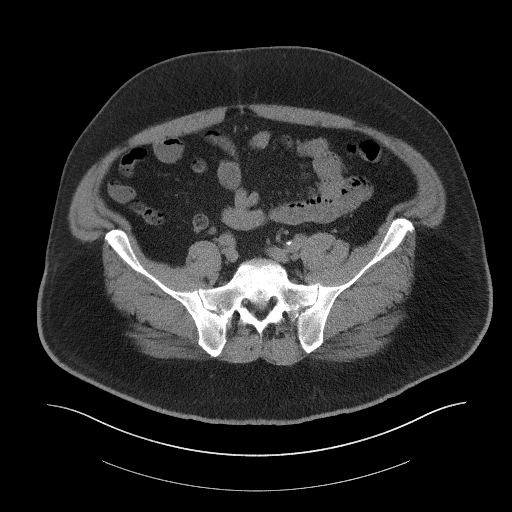
[im 55/105  soft-tissue]
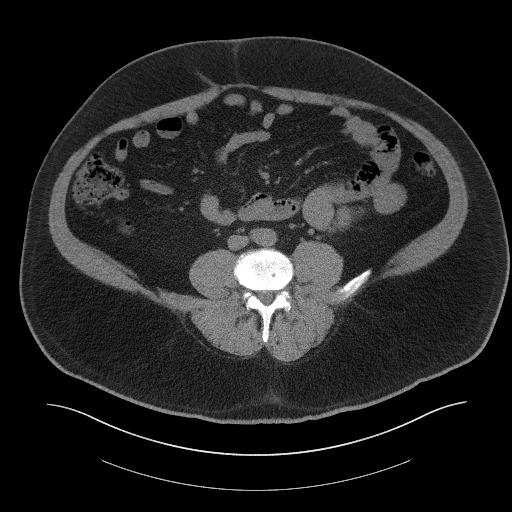
[im 61/105  soft-tissue]
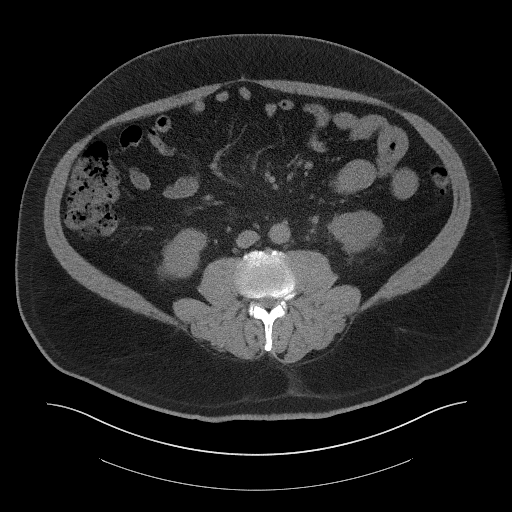
[im 66/105  soft-tissue]
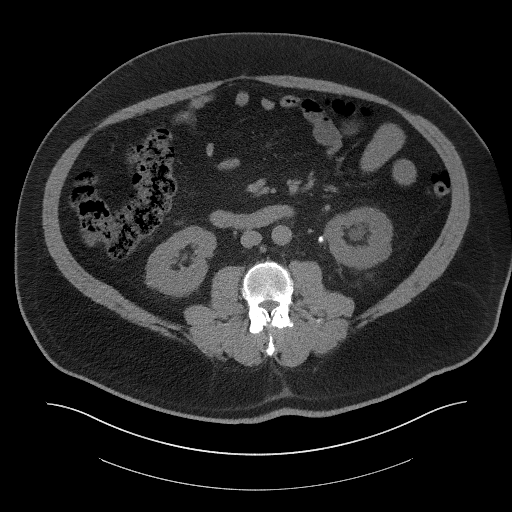
[im 66/105  bone]
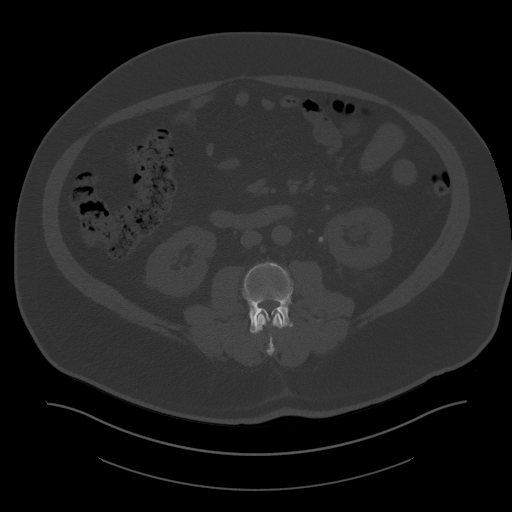
[im 77/105  soft-tissue]
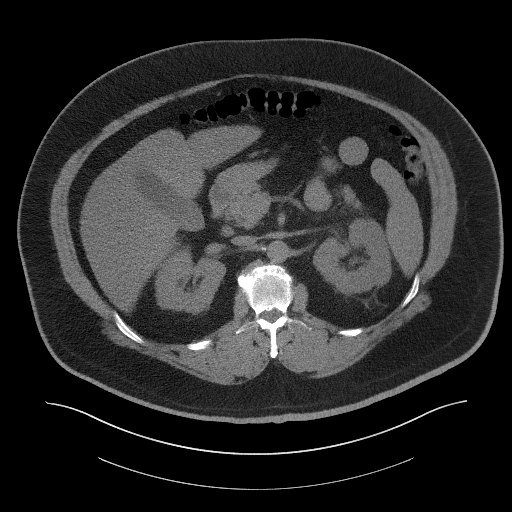
[im 83/105  soft-tissue]
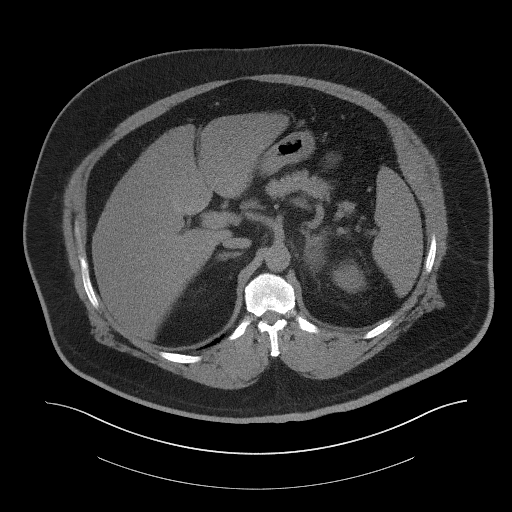
[im 88/105  soft-tissue]
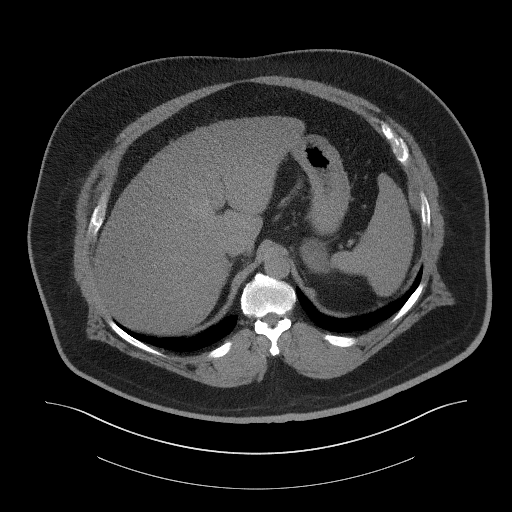
[im 99/105  soft-tissue]
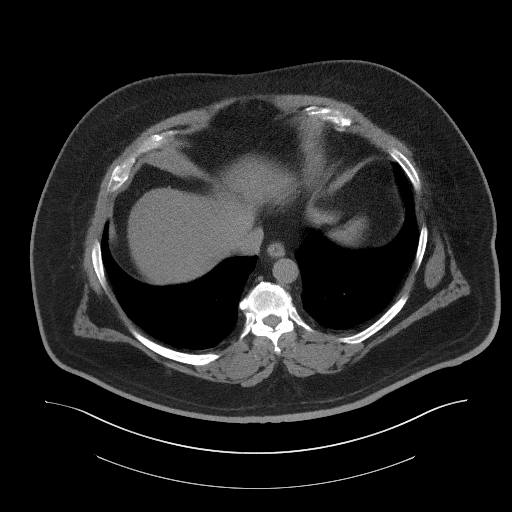

[Series 5: coronal · coronal · 0.95mm/px · 3 of 183 slices shown]
[im 61/183  soft-tissue]
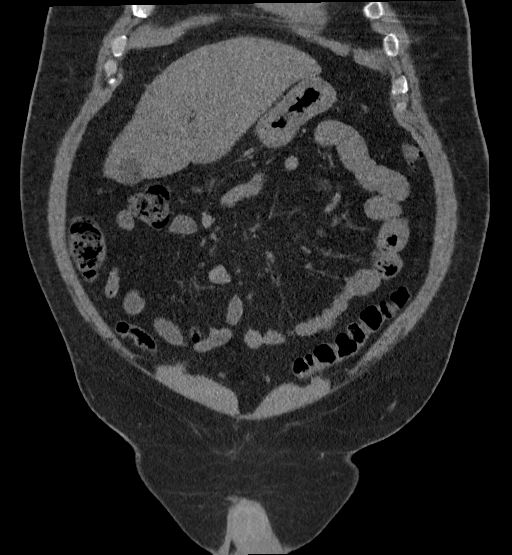
[im 81/183  soft-tissue]
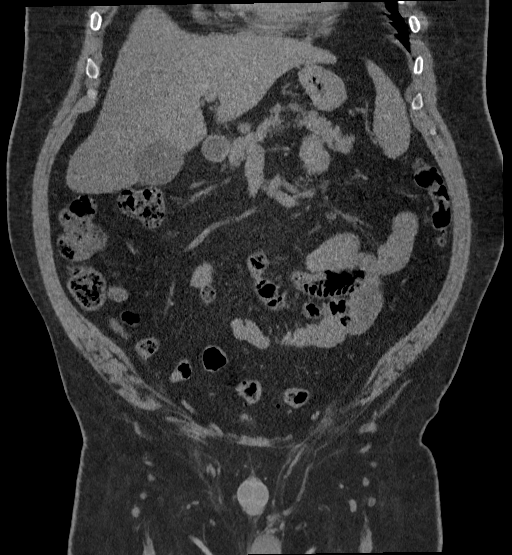
[im 102/183  soft-tissue]
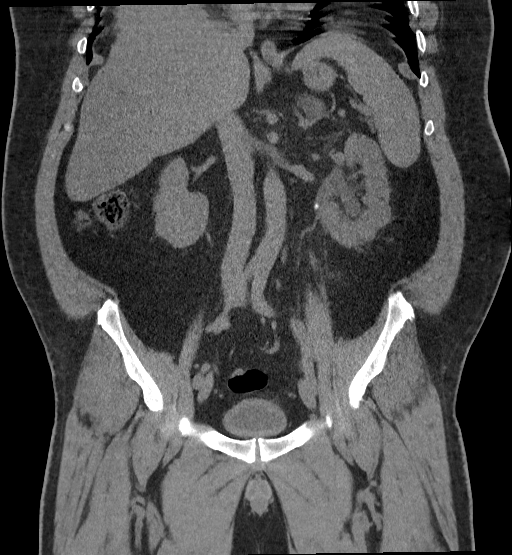

[16 of 46 positions shown; findings below may reference images not displayed]

FINDINGS: Lower chest: Visualized lung bases are clear. The visualized heart
and pericardium are unremarkable.

Hepatobiliary: No focal liver abnormality is seen. No gallstones,
gallbladder wall thickening, or biliary dilatation.

Pancreas: Unremarkable

Spleen: Unremarkable

Adrenals/Urinary Tract: The right adrenal gland is unremarkable.
Stable 4.6 cm left adrenal adenoma. The kidneys are normal in size
and position. There is mild left hydronephrosis and moderate
perinephric stranding secondary to an obstructing 4 mm calculus
within the left ureteropelvic junction. No additional nephro or
urolithiasis. No hydronephrosis on the right. The bladder is
unremarkable.

Stomach/Bowel: A densely calcified densities again seen within the
subxiphoid region of the abdomen within the mesenteric fat
demonstrating increasing surrounding fluid component likely
representing an area of fat necrosis and possibly the sequela of
remote trauma. The stomach, small bowel, and large bowel are
unremarkable. Appendix absent. No free intraperitoneal gas or
fluid.

Vascular/Lymphatic: Mild aortoiliac atherosclerotic calcification.
No aortic aneurysm. No pathologic adenopathy within the abdomen and
pelvis.

Reproductive: Prostate is unremarkable.

Other: Small fat containing umbilical hernia. Tiny fat containing
bilateral inguinal hernias.

Musculoskeletal: No lytic or blastic bone lesions. No acute bone
abnormality.
IMPRESSION: Obstructing 4 mm calculus at the left ureteropelvic junction
resulting in mild left hydronephrosis.

## 2021-05-28 ENCOUNTER — Emergency Department
Admission: EM | Admit: 2021-05-28 | Discharge: 2021-05-28 | Disposition: A | Discharge status: Home / Self Care | Payer: 59 | Attending: Emergency Medicine | Admitting: Emergency Medicine

## 2021-05-28 ENCOUNTER — Encounter: Payer: Self-pay | Admitting: Emergency Medicine

## 2021-05-28 ENCOUNTER — Emergency Department: Payer: 59

## 2021-05-28 DIAGNOSIS — N132 Hydronephrosis with renal and ureteral calculous obstruction: Secondary | ICD-10-CM | POA: Insufficient documentation

## 2021-05-28 DIAGNOSIS — Z7984 Long term (current) use of oral hypoglycemic drugs: Secondary | ICD-10-CM | POA: Diagnosis not present

## 2021-05-28 DIAGNOSIS — D3500 Benign neoplasm of unspecified adrenal gland: Secondary | ICD-10-CM | POA: Diagnosis not present

## 2021-05-28 DIAGNOSIS — N200 Calculus of kidney: Secondary | ICD-10-CM

## 2021-05-28 DIAGNOSIS — K5730 Diverticulosis of large intestine without perforation or abscess without bleeding: Secondary | ICD-10-CM | POA: Diagnosis not present

## 2021-05-28 DIAGNOSIS — N201 Calculus of ureter: Secondary | ICD-10-CM | POA: Diagnosis not present

## 2021-05-28 DIAGNOSIS — I10 Essential (primary) hypertension: Secondary | ICD-10-CM | POA: Diagnosis not present

## 2021-05-28 DIAGNOSIS — N1330 Unspecified hydronephrosis: Secondary | ICD-10-CM | POA: Diagnosis not present

## 2021-05-28 DIAGNOSIS — E119 Type 2 diabetes mellitus without complications: Secondary | ICD-10-CM | POA: Insufficient documentation

## 2021-05-28 DIAGNOSIS — Z79899 Other long term (current) drug therapy: Secondary | ICD-10-CM | POA: Diagnosis not present

## 2021-05-28 DIAGNOSIS — R1032 Left lower quadrant pain: Secondary | ICD-10-CM | POA: Diagnosis present

## 2021-05-28 LAB — URINALYSIS, ROUTINE W REFLEX MICROSCOPIC
Bilirubin Urine: NEGATIVE
Glucose, UA: 500 mg/dL — AB
Ketones, ur: NEGATIVE mg/dL
Leukocytes,Ua: NEGATIVE
Nitrite: NEGATIVE
Protein, ur: NEGATIVE mg/dL
Specific Gravity, Urine: 1.02 (ref 1.005–1.030)
pH: 5.5 (ref 5.0–8.0)

## 2021-05-28 LAB — CBC
HCT: 46 % (ref 39.0–52.0)
Hemoglobin: 15.4 g/dL (ref 13.0–17.0)
MCH: 29.9 pg (ref 26.0–34.0)
MCHC: 33.5 g/dL (ref 30.0–36.0)
MCV: 89.3 fL (ref 80.0–100.0)
Platelets: 224 10*3/uL (ref 150–400)
RBC: 5.15 MIL/uL (ref 4.22–5.81)
RDW: 12.9 % (ref 11.5–15.5)
WBC: 6.8 10*3/uL (ref 4.0–10.5)
nRBC: 0 % (ref 0.0–0.2)

## 2021-05-28 LAB — URINALYSIS, MICROSCOPIC (REFLEX): Bacteria, UA: NONE SEEN

## 2021-05-28 LAB — BASIC METABOLIC PANEL
Anion gap: 8 (ref 5–15)
BUN: 25 mg/dL — ABNORMAL HIGH (ref 6–20)
CO2: 23 mmol/L (ref 22–32)
Calcium: 9.1 mg/dL (ref 8.9–10.3)
Chloride: 102 mmol/L (ref 98–111)
Creatinine, Ser: 1 mg/dL (ref 0.61–1.24)
GFR, Estimated: >60 mL/min (ref >60)
Glucose, Bld: 258 mg/dL — ABNORMAL HIGH (ref 70–99)
Potassium: 3.8 mmol/L (ref 3.5–5.1)
Sodium: 133 mmol/L — ABNORMAL LOW (ref 135–145)

## 2021-05-28 MED ORDER — OXYCODONE-ACETAMINOPHEN 5-325 MG PO TABS: 2.0000 | ORAL_TABLET | Freq: Four times a day (QID) | ORAL | 0 refills | Status: DC | PRN

## 2021-05-28 MED ORDER — ONDANSETRON HCL 4 MG/2ML IJ SOLN
4.0000 mg | Freq: Once | INTRAMUSCULAR | Status: AC
  Administered 2021-05-28: 4 mg via INTRAVENOUS
  Filled 2021-05-28: qty 2

## 2021-05-28 MED ORDER — SODIUM CHLORIDE 0.9 % IV BOLUS (SEPSIS)
1000.0000 mL | Freq: Once | INTRAVENOUS | Status: AC
  Administered 2021-05-28: 1000 mL via INTRAVENOUS

## 2021-05-28 MED ORDER — KETOROLAC TROMETHAMINE 30 MG/ML IJ SOLN
30.0000 mg | Freq: Once | INTRAMUSCULAR | Status: AC
  Administered 2021-05-28: 30 mg via INTRAVENOUS
  Filled 2021-05-28: qty 1

## 2021-05-28 MED ORDER — ONDANSETRON 4 MG PO TBDP: 4.0000 mg | ORAL_TABLET | Freq: Four times a day (QID) | ORAL | 0 refills | Status: DC | PRN

## 2021-05-28 MED ORDER — TAMSULOSIN HCL 0.4 MG PO CAPS: 0.4000 mg | ORAL_CAPSULE | Freq: Every day | ORAL | 0 refills | Status: DC

## 2021-05-28 MED ORDER — IBUPROFEN 800 MG PO TABS: 800.0000 mg | ORAL_TABLET | Freq: Three times a day (TID) | ORAL | 0 refills | Status: DC | PRN

## 2021-05-28 NOTE — Discharge Instructions (Addendum)
You are being provided a prescription for opiates (also known as narcotics) for pain control.  Opiates can be addictive and should only be used when absolutely necessary for pain control when other alternatives do not work.  We recommend you only use them for the recommended amount of time and only as prescribed.  Please do not take with other sedative medications or alcohol.  Please do not drive, operate machinery, make important decisions while taking opiates.  Please note that these medications can be addictive and have high abuse potential.  Patients can become addicted to narcotics after only taking them for a few days.  Please keep these medications locked away from children, teenagers or any family members with history of substance abuse.  Narcotic pain medicine may also make you constipated.  You may use over-the-counter medications such as MiraLAX, Colace to prevent constipation.  If you become constipated you may use over-the-counter enemas as needed.  Itching and nausea are common side effects of narcotic pain medication.  If you develop uncontrolled vomiting or a rash, please stop these medications.   You have a 3 mm kidney stone in the left distal ureter.  Urine showed no sign of infection.  Blood work was reassuring today.  Please take your pain and nausea medications as needed.  Please take Flomax daily until your kidney stone has passed.  If your pain is not improving, I recommend close follow-up with urology.  If you begin having worsening pain that is uncontrolled with oral medications at home, have fever of 100.4 or higher, vomiting that does not stop, unable to urinate for several hours, please return to the emergency department.

## 2021-05-28 NOTE — ED Triage Notes (Signed)
Pt in with sharp L flank pain, began when he woke to use the bathroom last night at 10:30 pm. Hx of kidney stones, denies any n/v, hematuria or fevers.

## 2021-05-28 NOTE — ED Provider Notes (Signed)
[facility] Emergency Department Provider Note  ____________________________________________   Event Date/Time   First MD Initiated Contact with Patient 05/28/21 [phone]     (approximate)  I have reviewed the triage vital signs and the nursing notes.   HISTORY  Chief Complaint Flank Pain    HPI Ricardo Nelson is a 55 y.o. male with history of hypertension, diabetes, hyperlipidemia, gout, kidney stones who has had a previous cystoscopy, ureteroscopy, holmium laser and stent placement by Dr. Diamantina Providence who presents to the emergency department with complaints of left flank pain that is sharp, moderate in nature that started tonight.  Feels like his previous kidney stones.  No back injury that he can recall.  No numbness, tingling or weakness.  No fevers, nausea or vomiting, diarrhea, dysuria or hematuria.        Past Medical History:  Diagnosis Date   Diabetes mellitus without complication ([place])    Gout    Hyperlipemia    Hypertension     There are no problems to display for this patient.   Past Surgical History:  Procedure Laterality Date   APPENDECTOMY     CYSTOSCOPY/URETEROSCOPY/HOLMIUM LASER/STENT PLACEMENT Left 09/04/2020   Procedure: CYSTOSCOPY/URETEROSCOPY/HOLMIUM LASER/STENT PLACEMENT;  Surgeon: Billey Co, MD;  Location: [facility] ORS;  Service: Urology;  Laterality: Left;   TONSILLECTOMY      Prior to Admission medications   Medication Sig Start Date End Date Taking? Authorizing Provider  ibuprofen (ADVIL) 800 MG tablet Take 1 tablet (800 mg total) by mouth every 8 (eight) hours as needed for mild pain. 05/28/21  Yes Fahd Galea N, DO  ondansetron (ZOFRAN-ODT) 4 MG disintegrating tablet Take 1 tablet (4 mg total) by mouth every 6 (six) hours as needed for nausea or vomiting. 05/28/21  Yes Aletta Edmunds, Delice Bison, DO  oxyCODONE-acetaminophen (PERCOCET) 5-325 MG tablet Take 2 tablets by mouth every 6 (six) hours as needed for severe pain. 05/28/21  05/28/22 Yes Camdin Hegner, Delice Bison, DO  tamsulosin (FLOMAX) 0.4 MG CAPS capsule Take 1 capsule (0.4 mg total) by mouth daily. 05/28/21  Yes Hobie Kohles, Delice Bison, DO  atorvastatin (LIPITOR) 20 MG tablet Take 1 tablet (20 mg total) by mouth at bedtime. 11/22/20   Sable Feil, PA-C  Blood Glucose Monitoring Suppl w/Device KIT Use to check blood sugar as instructed 07/13/19   Jan Fireman, PA-C  glucose blood test strip Use as instructed 07/13/19   Jan Fireman, PA-C  icosapent Ethyl (VASCEPA) 1 g capsule Take 2 capsules (2 g total) by mouth 2 (two) times daily. 08/27/20 11/25/20  Apolonio Schneiders, FNP  indomethacin (INDOCIN) 50 MG capsule Take 1 capsule (50 mg total) by mouth 2 (two) times daily as needed (use lowest effective dose, shortest effective duration- take with food when possible). Patient taking differently: Take 50 mg by mouth 2 (two) times daily as needed (use lowest effective dose, shortest effective duration- take with food when possible/ Gout). 07/18/19   Jan Fireman, PA-C  Lancets (FREESTYLE) lancets Use as instructed 07/13/19   Jan Fireman, PA-C  lisinopril-hydrochlorothiazide (ZESTORETIC) 20-12.5 MG tablet Take 2 tablets by mouth daily. 11/22/20   Sable Feil, PA-C  metFORMIN (GLUCOPHAGE) 850 MG tablet TAKE 1 TABLET(850 MG) BY MOUTH TWICE DAILY WITH A MEAL 11/22/20   Sable Feil, PA-C    Allergies Patient has no known allergies.  No family history on file.  Social History Social History   Tobacco Use   Smoking status: Never  Smokeless tobacco: Never  Vaping Use   Vaping Use: Never used  Substance Use Topics   Alcohol use: Yes    Comment: 2 drinks a week   Drug use: Not Currently    Review of Systems Constitutional: No fever. Eyes: No visual changes. ENT: No sore throat. Cardiovascular: Denies chest pain. Respiratory: Denies shortness of breath. Gastrointestinal: No nausea, vomiting, diarrhea. Genitourinary: Negative for dysuria. Musculoskeletal: + for back pain. Skin:  Negative for rash. Neurological: Negative for focal weakness or numbness.  ____________________________________________   PHYSICAL EXAM:  VITAL SIGNS: ED Triage Vitals  Enc Vitals Group     BP 05/28/21 0218 (!) 155/98     Pulse Rate 05/28/21 0218 85     Resp 05/28/21 0218 18     Temp 05/28/21 0218 98.6 F (37 C)     Temp Source 05/28/21 0218 Oral     SpO2 05/28/21 0218 97 %     Weight 05/28/21 0220 (!) 305 lb (138.3 kg)     Height --      Head Circumference --      Peak Flow --      Pain Score --      Pain Loc --      Pain Edu? --      Excl. in [place]? --    CONSTITUTIONAL: Alert and oriented and responds appropriately to questions. Well-appearing; well-nourished, obese HEAD: Normocephalic EYES: Conjunctivae clear, pupils appear equal, EOM appear intact ENT: normal nose; moist mucous membranes NECK: Supple, normal ROM CARD: RRR; S1 and S2 appreciated; no murmurs, no clicks, no rubs, no gallops RESP: Normal chest excursion without splinting or tachypnea; breath sounds clear and equal bilaterally; no wheezes, no rhonchi, no rales, no hypoxia or respiratory distress, speaking full sentences ABD/GI: Normal bowel sounds; non-distended; soft, non-tender, no rebound, no guarding, no peritoneal signs, no hepatosplenomegaly BACK: The back appears normal, no midline spinal tenderness or step-off or deformity, no CVA tenderness EXT: Normal ROM in all joints; no deformity noted, no edema; no cyanosis SKIN: Normal color for age and race; warm; no rash on exposed skin NEURO: Moves all extremities equally, ambulates with normal gait, normal sensation diffusely PSYCH: The patient's mood and manner are appropriate.  ____________________________________________   LABS (all labs ordered are listed, but only abnormal results are displayed)  Labs Reviewed  URINALYSIS, ROUTINE W REFLEX MICROSCOPIC - Abnormal; Notable for the following components:      Result Value   Glucose, UA 500 (*)    Hgb  urine dipstick LARGE (*)    All other components within normal limits  BASIC METABOLIC PANEL - Abnormal; Notable for the following components:   Sodium 133 (*)    Glucose, Bld 258 (*)    BUN 25 (*)    All other components within normal limits  CBC  URINALYSIS, MICROSCOPIC (REFLEX)   ____________________________________________  EKG   ____________________________________________  RADIOLOGY I, Jermal Dismuke, personally viewed and evaluated these images (plain radiographs) as part of my medical decision making, as well as reviewing the written report by the radiologist.  ED MD interpretation: CT scan shows 3 mm distal left ureteral stone.  Official radiology report(s): CT Renal Stone Study  Result Date: 05/28/2021 CLINICAL DATA:  Left flank pain EXAM: CT ABDOMEN AND PELVIS WITHOUT CONTRAST TECHNIQUE: Multidetector CT imaging of the abdomen and pelvis was performed following the standard protocol without IV contrast. COMPARISON:  08/23/2020, MRI 03/21/2007 FINDINGS: Lower chest: Visualized lung bases are clear bilaterally. The visualized heart and  pericardium are unremarkable. Hepatobiliary: Mild hepatic steatosis with focal fatty sparing within the gallbladder fossa. No intrahepatic mass identified on this noncontrast examination. No intra or extrahepatic biliary ductal dilation. Gallbladder unremarkable. Pancreas: Unremarkable Spleen: Unremarkable Adrenals/Urinary Tract: Stable 4.5 cm left adrenal adenoma. Right adrenal gland is unremarkable. Kidneys are normal in size and position. Interval development of mild left hydronephrosis and perinephric stranding secondary to an obstructing 3 mm calculus within the distal left ureter which has migrated from the left ureteropelvic junction on prior examination. No additional nephro or urolithiasis. No hydronephrosis on the right. The bladder is unremarkable. Stomach/Bowel: Moderate descending and sigmoid colonic diverticulosis. The stomach, small  bowel, and large bowel are otherwise unremarkable. Appendix absent. Partially calcified soft tissue mass within the subxiphoid region is unchanged in keeping with an area of fat necrosis, possibly the sequela of remote trauma. Vascular/Lymphatic: Aortic atherosclerosis. No enlarged abdominal or pelvic lymph nodes. Reproductive: Prostate gland is unremarkable. Other: Small fat containing umbilical hernia and bilateral inguinal hernias Musculoskeletal: No acute bone abnormality. IMPRESSION: Antegrade migration of a 3 mm ureteral calculus now within the distal left ureter just proximal to the ureteropelvic junction. Persistent mild left hydronephrosis and perinephric stranding. Mild hepatic steatosis. Stable 4.5 cm left adrenal adenoma. Moderate distal colonic diverticulosis without superimposed acute inflammatory change. Aortic Atherosclerosis (ICD10-I70.0). Electronically Signed   By: Fidela Salisbury M.D.   On: 05/28/2021 03:49    ____________________________________________   PROCEDURES  Procedure(s) performed (including Critical Care):  Procedures   ____________________________________________   INITIAL IMPRESSION / ASSESSMENT AND PLAN / ED COURSE  As part of my medical decision making, I reviewed the following data within the [place] notes reviewed and incorporated, Labs reviewed , Old chart reviewed, CT reviewed, Notes from prior ED visits, and [place] Controlled Substance Database         Patient here with sudden onset left flank pain.  History of kidney stones and states this feels similar.  Differential includes kidney stone, pyelonephritis, UTI, musculoskeletal back pain, radiculopathy.  Doubt cauda equina, epidural abscess or hematoma, discitis or osteomyelitis, transverse myelitis.  Abdominal exam is benign.  Low suspicion for appendicitis, bowel obstruction, diverticulitis, perforation.  Will obtain labs, urine and CT of the abdomen pelvis.  He drove himself to  the emergency department and would like to drive home.  Will give IV fluids, Toradol and Zofran.  ED PROGRESS  Patient reports his pain is gone after Toradol.  CT scan shows 3 mm distal ureteral stone on the left.  Urine shows no sign of infection.  He reports feeling much better.  Will discharge with pain and nausea medicine and Flomax.  Given outpatient urology follow-up.  Discussed return precautions.   At this time, I do not feel there is any life-threatening condition present. I have reviewed, interpreted and discussed all results (EKG, imaging, lab, urine as appropriate) and exam findings with patient/family. I have reviewed nursing notes and appropriate previous records.  I feel the patient is safe to be discharged home without further emergent workup and can continue workup as an outpatient as needed. Discussed usual and customary return precautions. Patient/family verbalize understanding and are comfortable with this plan.  Outpatient follow-up has been provided as needed. All questions have been answered.  ____________________________________________   FINAL CLINICAL IMPRESSION(S) / ED DIAGNOSES  Final diagnoses:  Kidney stone on left side     ED Discharge Orders          Ordered    oxyCODONE-acetaminophen (  PERCOCET) 5-325 MG tablet  Every 6 hours PRN        05/28/21 0450    ondansetron (ZOFRAN-ODT) 4 MG disintegrating tablet  Every 6 hours PRN        05/28/21 0450    ibuprofen (ADVIL) 800 MG tablet  Every 8 hours PRN        05/28/21 0450    tamsulosin (FLOMAX) 0.4 MG CAPS capsule  Daily        05/28/21 0450            *Please note:  Ricardo Nelson was evaluated in Emergency Department on 05/28/2021 for the symptoms described in the history of present illness. He was evaluated in the context of the global COVID-19 pandemic, which necessitated consideration that the patient might be at risk for infection with the SARS-CoV-2 virus that causes COVID-19. Institutional  protocols and algorithms that pertain to the evaluation of patients at risk for COVID-19 are in a state of rapid change based on information released by regulatory bodies including the CDC and federal and state organizations. These policies and algorithms were followed during the patient's care in the ED.  Some ED evaluations and interventions may be delayed as a result of limited staffing during and the pandemic.*   Note:  This document was prepared using Dragon voice recognition software and may include unintentional dictation errors.    Jaylend Reiland, Delice Bison, DO 05/28/21 [phone]

## 2021-06-03 DIAGNOSIS — I208 Other forms of angina pectoris: Secondary | ICD-10-CM | POA: Diagnosis not present

## 2021-06-03 DIAGNOSIS — I10 Essential (primary) hypertension: Secondary | ICD-10-CM | POA: Diagnosis not present

## 2021-06-03 DIAGNOSIS — R0602 Shortness of breath: Secondary | ICD-10-CM | POA: Diagnosis not present

## 2021-07-02 ENCOUNTER — Other Ambulatory Visit: Payer: Self-pay

## 2021-07-02 DIAGNOSIS — M1A0710 Idiopathic chronic gout, right ankle and foot, without tophus (tophi): Secondary | ICD-10-CM

## 2021-07-02 DIAGNOSIS — M109 Gout, unspecified: Secondary | ICD-10-CM | POA: Insufficient documentation

## 2021-07-02 MED ORDER — INDOMETHACIN 50 MG PO CAPS: 50.0000 mg | ORAL_CAPSULE | Freq: Two times a day (BID) | ORAL | 1 refills | Status: DC | PRN

## 2021-08-22 ENCOUNTER — Other Ambulatory Visit: Payer: Self-pay

## 2021-08-22 DIAGNOSIS — Z87442 Personal history of urinary calculi: Secondary | ICD-10-CM

## 2021-08-22 MED ORDER — TAMSULOSIN HCL 0.4 MG PO CAPS: 0.4000 mg | ORAL_CAPSULE | Freq: Every day | ORAL | 0 refills | Status: DC

## 2021-09-09 ENCOUNTER — Other Ambulatory Visit: Payer: Self-pay

## 2021-09-09 ENCOUNTER — Emergency Department: Payer: 59

## 2021-09-09 ENCOUNTER — Emergency Department
Admission: EM | Admit: 2021-09-09 | Discharge: 2021-09-09 | Disposition: A | Discharge status: Home / Self Care | Payer: 59 | Attending: Emergency Medicine | Admitting: Emergency Medicine

## 2021-09-09 DIAGNOSIS — D3500 Benign neoplasm of unspecified adrenal gland: Secondary | ICD-10-CM | POA: Diagnosis not present

## 2021-09-09 DIAGNOSIS — K5730 Diverticulosis of large intestine without perforation or abscess without bleeding: Secondary | ICD-10-CM | POA: Diagnosis not present

## 2021-09-09 DIAGNOSIS — Z87442 Personal history of urinary calculi: Secondary | ICD-10-CM | POA: Insufficient documentation

## 2021-09-09 DIAGNOSIS — E119 Type 2 diabetes mellitus without complications: Secondary | ICD-10-CM | POA: Insufficient documentation

## 2021-09-09 DIAGNOSIS — R109 Unspecified abdominal pain: Secondary | ICD-10-CM | POA: Insufficient documentation

## 2021-09-09 DIAGNOSIS — I10 Essential (primary) hypertension: Secondary | ICD-10-CM | POA: Insufficient documentation

## 2021-09-09 DIAGNOSIS — N201 Calculus of ureter: Secondary | ICD-10-CM | POA: Diagnosis not present

## 2021-09-09 DIAGNOSIS — K4020 Bilateral inguinal hernia, without obstruction or gangrene, not specified as recurrent: Secondary | ICD-10-CM | POA: Diagnosis not present

## 2021-09-09 LAB — URINALYSIS, ROUTINE W REFLEX MICROSCOPIC
Bilirubin Urine: NEGATIVE
Glucose, UA: NEGATIVE mg/dL
Hgb urine dipstick: NEGATIVE
Ketones, ur: NEGATIVE mg/dL
Leukocytes,Ua: NEGATIVE
Nitrite: NEGATIVE
Protein, ur: NEGATIVE mg/dL
Specific Gravity, Urine: 1.004 — ABNORMAL LOW (ref 1.005–1.030)
pH: 5 (ref 5.0–8.0)

## 2021-09-09 LAB — BASIC METABOLIC PANEL
Anion gap: 8 (ref 5–15)
BUN: 26 mg/dL — ABNORMAL HIGH (ref 6–20)
CO2: 29 mmol/L (ref 22–32)
Calcium: 9.7 mg/dL (ref 8.9–10.3)
Chloride: 101 mmol/L (ref 98–111)
Creatinine, Ser: 1.15 mg/dL (ref 0.61–1.24)
GFR, Estimated: >60 mL/min (ref >60)
Glucose, Bld: 120 mg/dL — ABNORMAL HIGH (ref 70–99)
Potassium: 3.9 mmol/L (ref 3.5–5.1)
Sodium: 138 mmol/L (ref 135–145)

## 2021-09-09 LAB — CBC
HCT: 47 % (ref 39.0–52.0)
Hemoglobin: 15.3 g/dL (ref 13.0–17.0)
MCH: 29 pg (ref 26.0–34.0)
MCHC: 32.6 g/dL (ref 30.0–36.0)
MCV: 89 fL (ref 80.0–100.0)
Platelets: 274 10*3/uL (ref 150–400)
RBC: 5.28 MIL/uL (ref 4.22–5.81)
RDW: 13.2 % (ref 11.5–15.5)
WBC: 8 10*3/uL (ref 4.0–10.5)
nRBC: 0 % (ref 0.0–0.2)

## 2021-09-09 MED ORDER — KETOROLAC TROMETHAMINE 30 MG/ML IJ SOLN
15.0000 mg | Freq: Once | INTRAMUSCULAR | Status: AC
Start: 2021-09-09 — End: 2021-09-09
  Administered 2021-09-09: 15 mg via INTRAVENOUS
  Filled 2021-09-09: qty 1

## 2021-09-09 MED ORDER — LIDOCAINE 5 % EX PTCH: 1.0000 | MEDICATED_PATCH | CUTANEOUS | 0 refills | Status: AC

## 2021-09-09 MED ORDER — OXYCODONE-ACETAMINOPHEN 5-325 MG PO TABS
1.0000 | ORAL_TABLET | Freq: Once | ORAL | Status: AC
  Administered 2021-09-09: 1 via ORAL
  Filled 2021-09-09: qty 1

## 2021-09-09 NOTE — ED Triage Notes (Signed)
Pt presents to ER c/o right side flank pain that has been going on for around a day and a half.  Pt has hx of kidney stones in past.  Pt endorses some increased urinary frequency.  Pt is A&O x4 at this time in NAD at this time.   ?

## 2021-09-09 NOTE — ED Notes (Signed)
ED Provider at bedside. 

## 2021-09-09 NOTE — Discharge Instructions (Addendum)
Your CAT scan did not show any kidney stone.  I suspect that your pain is musculoskeletal in origin.  You can take ibuprofen 400 mg every 6 hours and you can use the Lidoderm patch which I have sent to your pharmacy. ?

## 2021-09-09 NOTE — ED Provider Notes (Signed)
? ?[facility] ?Provider Note ? ? ? Event Date/Time  ? First MD Initiated Contact with Patient 09/09/21 2101   ?  (approximate) ? ? ?History  ? ?Flank Pain ? ? ?HPI ? ?Ricardo Nelson is a 56 y.o. male with past medical history of diabetes, gout, hypertension hyperlipidemia presents with right flank pain.  Patient notes that he has had some dull pain in the right flank for about 2 months.  Worse with movement.  It worsened after he played golf and was twisting his back frequently.  Pain is located over the right flank does not radiate no numbness tingling weakness down his legs.  Denies dysuria hematuria frequency urgency.  Denies fevers chills nausea vomiting.  Has had history of kidney stones thinks this may be feel similar ?  ? ?Past Medical History:  ?Diagnosis Date  ? Diabetes mellitus without complication ([place])   ? Gout   ? Hyperlipemia   ? Hypertension   ? ? ?Patient Active Problem List  ? Diagnosis Date Noted  ? Gout 07/02/2021  ? ? ? ?Physical Exam  ?Triage Vital Signs: ?ED Triage Vitals  ?Enc Vitals Group  ?   BP 09/09/21 2051 (!) 139/91  ?   Pulse Rate 09/09/21 2051 72  ?   Resp 09/09/21 2051 16  ?   Temp 09/09/21 2051 97.7 ?F (36.5 ?C)  ?   Temp Source 09/09/21 2051 Oral  ?   SpO2 09/09/21 2051 98 %  ?   Weight 09/09/21 2053 280 lb (127 kg)  ?   Height 09/09/21 2053 5\' 11"  (1.803 m)  ?   Head Circumference --   ?   Peak Flow --   ?   Pain Score 09/09/21 2053 9  ?   Pain Loc --   ?   Pain Edu? --   ?   Excl. in [place]? --   ? ? ?Most recent vital signs: ?Vitals:  ? 09/09/21 2051 09/09/21 2133  ?BP: (!) 139/91 136/77  ?Pulse: 72 80  ?Resp: 16 17  ?Temp: 97.7 ?F (36.5 ?C)   ?SpO2: 98% 98%  ? ? ? ?General: Awake, no distress.  ?CV:  Good peripheral perfusion.  ?Resp:  Normal effort.  ?Abd:  No distention.  Abdomen is soft and nontender ?Neuro:             Awake, Alert, Oriented x 3  ?Other:  Mild right CVA tenderness, no midline lumbar thoracic tenderness ? ? ?ED Results / Procedures /  Treatments  ?Labs ?(all labs ordered are listed, but only abnormal results are displayed) ?Labs Reviewed  ?BASIC METABOLIC PANEL - Abnormal; Notable for the following components:  ?    Result Value  ? Glucose, Bld 120 (*)   ? BUN 26 (*)   ? All other components within normal limits  ?URINALYSIS, ROUTINE W REFLEX MICROSCOPIC - Abnormal; Notable for the following components:  ? Color, Urine COLORLESS (*)   ? APPearance CLEAR (*)   ? Specific Gravity, Urine 1.004 (*)   ? All other components within normal limits  ?CBC  ? ? ? ?EKG ? ? ? ?RADIOLOGY ?CT renal study reviewed by myself is negative for stones or other acute process ? ? ?PROCEDURES: ? ?Critical Care performed: No ? ?Procedures ? ? ? ?MEDICATIONS ORDERED IN ED: ?Medications  ?oxyCODONE-acetaminophen (PERCOCET/ROXICET) 5-325 MG per tablet 1 tablet (has no administration in time range)  ?ketorolac (TORADOL) 30 MG/ML injection 15 mg (15 mg Intravenous Given  09/09/21 2152)  ? ? ? ?IMPRESSION / MDM / ASSESSMENT AND PLAN / ED COURSE  ?I reviewed the triage vital signs and the nursing notes. ?             ?               ? ?Differential diagnosis includes, but is not limited to, kidney stone, muscle strain, less likely pyelo, AAA ? ?Patient is a 56 year old male who presents with right flank pain.  Symptoms have been going on for about 2 months but were initially rather low-grade until several days ago when he was playing golf symptoms have been worse.  It is clearly worse with movement he has no other systemic symptoms including fevers chills nausea vomiting diarrhea dysuria hematuria urgency or frequency.  Does have history of kidney stones thinks this feels somewhat similar.  On exam he appears quite well.  He is tender over the right CVA region but pain is clearly worse when he is stretching his back.  Seems to be more musculoskeletal in nature.  UA without hematuria or pyuria, CBC and BMP overall reassuring.  Plan to obtain CT renal study given the chronicity of  the symptoms both to rule out stone but also to evaluate for renal mass or other intra-abdominal process which could be contributing to his symptoms. ? ?CT renal study is negative and there are no other acute findings.  Suspect musculoskeletal etiology.  Recommended NSAIDs and Lidoderm patch.  He is appropriate for discharge at this time ?  ? ? ?FINAL CLINICAL IMPRESSION(S) / ED DIAGNOSES  ? ?Final diagnoses:  ?Flank pain  ? ? ? ?Rx / DC Orders  ? ?ED Discharge Orders   ? ?      Ordered  ?  lidocaine (LIDODERM) 5 %  Every 24 hours       ? 09/09/21 2255  ? ?  ?  ? ?  ? ? ? ?Note:  This document was prepared using Dragon voice recognition software and may include unintentional dictation errors. ?  ?Rada Hay, MD ?09/09/21 2256 ? ?

## 2021-09-09 NOTE — ED Notes (Signed)
Pt return from CT.

## 2021-09-10 ENCOUNTER — Ambulatory Visit: Payer: Self-pay

## 2021-09-10 DIAGNOSIS — Z0000 Encounter for general adult medical examination without abnormal findings: Secondary | ICD-10-CM

## 2021-09-10 LAB — POCT URINALYSIS DIPSTICK
Bilirubin, UA: NEGATIVE
Blood, UA: NEGATIVE
Glucose, UA: NEGATIVE
Ketones, UA: NEGATIVE
Leukocytes, UA: NEGATIVE
Nitrite, UA: NEGATIVE
Protein, UA: NEGATIVE
Spec Grav, UA: >=1.03 — AB (ref 1.010–1.025)
Urobilinogen, UA: 0.2 E.U./dL
pH, UA: 5.5 (ref 5.0–8.0)

## 2021-09-10 NOTE — Progress Notes (Signed)
Pt completed labs for physical. 

## 2021-09-11 LAB — CMP12+LP+TP+TSH+6AC+PSA+CBC…
ALT: 20 IU/L (ref 0–44)
AST: 15 IU/L (ref 0–40)
Albumin/Globulin Ratio: 1.7 (ref 1.2–2.2)
Albumin: 4.1 g/dL (ref 3.8–4.9)
Alkaline Phosphatase: 49 IU/L (ref 44–121)
BUN/Creatinine Ratio: 28 — ABNORMAL HIGH (ref 9–20)
BUN: 25 mg/dL — ABNORMAL HIGH (ref 6–24)
Basophils Absolute: 0 10*3/uL (ref 0.0–0.2)
Basos: 0 %
Bilirubin Total: 0.5 mg/dL (ref 0.0–1.2)
Calcium: 9.4 mg/dL (ref 8.7–10.2)
Chloride: 100 mmol/L (ref 96–106)
Chol/HDL Ratio: 4.3 ratio (ref 0.0–5.0)
Cholesterol, Total: 166 mg/dL (ref 100–199)
Creatinine, Ser: 0.89 mg/dL (ref 0.76–1.27)
EOS (ABSOLUTE): 0.2 10*3/uL (ref 0.0–0.4)
Eos: 4 %
Estimated CHD Risk: 0.8 times avg. (ref 0.0–1.0)
Free Thyroxine Index: 1.8 (ref 1.2–4.9)
GGT: 19 IU/L (ref 0–65)
Globulin, Total: 2.4 g/dL (ref 1.5–4.5)
Glucose: 145 mg/dL — ABNORMAL HIGH (ref 70–99)
HDL: 39 mg/dL — ABNORMAL LOW (ref >39)
Hematocrit: 43.7 % (ref 37.5–51.0)
Hemoglobin: 15.4 g/dL (ref 13.0–17.7)
Immature Grans (Abs): 0 10*3/uL (ref 0.0–0.1)
Immature Granulocytes: 0 %
Iron: 92 ug/dL (ref 38–169)
LDH: 150 IU/L (ref 121–224)
LDL Chol Calc (NIH): 101 mg/dL — ABNORMAL HIGH (ref 0–99)
Lymphocytes Absolute: 1.6 10*3/uL (ref 0.7–3.1)
Lymphs: 31 %
MCH: 30 pg (ref 26.6–33.0)
MCHC: 35.2 g/dL (ref 31.5–35.7)
MCV: 85 fL (ref 79–97)
Monocytes Absolute: 0.6 10*3/uL (ref 0.1–0.9)
Monocytes: 11 %
Neutrophils Absolute: 2.7 10*3/uL (ref 1.4–7.0)
Neutrophils: 54 %
Phosphorus: 4.8 mg/dL — ABNORMAL HIGH (ref 2.8–4.1)
Platelets: 248 10*3/uL (ref 150–450)
Potassium: 4 mmol/L (ref 3.5–5.2)
Prostate Specific Ag, Serum: 0.9 ng/mL (ref 0.0–4.0)
RBC: 5.14 x10E6/uL (ref 4.14–5.80)
RDW: 13 % (ref 11.6–15.4)
Sodium: 137 mmol/L (ref 134–144)
T3 Uptake Ratio: 28 % (ref 24–39)
T4, Total: 6.5 ug/dL (ref 4.5–12.0)
TSH: 1.58 u[IU]/mL (ref 0.450–4.500)
Total Protein: 6.5 g/dL (ref 6.0–8.5)
Triglycerides: 144 mg/dL (ref 0–149)
Uric Acid: 7.5 mg/dL (ref 3.8–8.4)
VLDL Cholesterol Cal: 26 mg/dL (ref 5–40)
WBC: 5.2 10*3/uL (ref 3.4–10.8)
eGFR: 100 mL/min/{1.73_m2} (ref >59)

## 2021-09-11 LAB — MICROALBUMIN / CREATININE URINE RATIO
Creatinine, Urine: 137.7 mg/dL
Microalb/Creat Ratio: 3 mg/g creat (ref 0–29)
Microalbumin, Urine: 4.4 ug/mL

## 2021-09-11 LAB — HGB A1C W/O EAG: Hgb A1c MFr Bld: 6.9 % — ABNORMAL HIGH (ref 4.8–5.6)

## 2021-09-16 ENCOUNTER — Ambulatory Visit: Payer: Self-pay | Admitting: Physician Assistant

## 2021-09-16 ENCOUNTER — Encounter: Payer: Self-pay | Admitting: Physician Assistant

## 2021-09-16 ENCOUNTER — Other Ambulatory Visit: Payer: Self-pay

## 2021-09-16 VITALS — BP 141/92 | HR 83 | Temp 98.0°F | Resp 14 | Ht 71.0 in | Wt 290.0 lb

## 2021-09-16 DIAGNOSIS — M549 Dorsalgia, unspecified: Secondary | ICD-10-CM

## 2021-09-16 DIAGNOSIS — S39012A Strain of muscle, fascia and tendon of lower back, initial encounter: Secondary | ICD-10-CM

## 2021-09-16 MED ORDER — KETOROLAC TROMETHAMINE 60 MG/2ML IM SOLN
60.0000 mg | Freq: Once | INTRAMUSCULAR | Status: AC
  Administered 2021-09-16: 60 mg via INTRAMUSCULAR

## 2021-09-16 MED ORDER — KETOROLAC TROMETHAMINE 10 MG PO TABS: 10.0000 mg | ORAL_TABLET | Freq: Four times a day (QID) | ORAL | 0 refills | Status: DC | PRN

## 2021-09-16 MED ORDER — ORPHENADRINE CITRATE ER 100 MG PO TB12: 100.0000 mg | ORAL_TABLET | Freq: Two times a day (BID) | ORAL | 0 refills | Status: DC

## 2021-09-16 MED ORDER — OXYCODONE-ACETAMINOPHEN 7.5-325 MG PO TABS: 1.0000 | ORAL_TABLET | Freq: Every evening | ORAL | 0 refills | Status: AC

## 2021-09-16 NOTE — Progress Notes (Signed)
? ?[facility] ?Emergency Department Provider Note ? ?____________________________________________ ? ? None  ?  (approximate) ? ?I have reviewed the triage vital signs and the nursing notes. ? ? ?HISTORY ? ?Chief Complaint ?Annual Exam ? ? ? ?HPI ?Ricardo Nelson is a 56 y.o. male patient presents for annual physical exam.  Patient complaining of 2 weeks of right flank pain.  Patient was seen in the emergency room on the 09/09/2021 with suspicion of kidney stone.  Patient urine was negative and CT scan was unremarkable except for diverticulosis.  Patient stated no relief with anti-inflammatory medications.  Patient denies radicular component to back pain.  Patient denies bladder or bowel dysfunction.  Past medical history remarkable for diabetes, gout, hyperlipidemia, hypertension.  Patient also admits to not fasting for last lab test prior to this appointment. ?   ? ?  ? ? ?Past Medical History:  ?Diagnosis Date  ? Diabetes mellitus without complication ([place])   ? Gout   ? Hyperlipemia   ? Hypertension   ? ? ?Patient Active Problem List  ? Diagnosis Date Noted  ? Gout 07/02/2021  ? ? ?Past Surgical History:  ?Procedure Laterality Date  ? APPENDECTOMY    ? CYSTOSCOPY/URETEROSCOPY/HOLMIUM LASER/STENT PLACEMENT Left 09/04/2020  ? Procedure: CYSTOSCOPY/URETEROSCOPY/HOLMIUM LASER/STENT PLACEMENT;  Surgeon: Billey Co, MD;  Location: [facility] ORS;  Service: Urology;  Laterality: Left;  ? TONSILLECTOMY    ? ? ?Prior to Admission medications   ?Medication Sig Start Date End Date Taking? Authorizing Provider  ?atorvastatin (LIPITOR) 20 MG tablet Take 1 tablet (20 mg total) by mouth at bedtime. 11/22/20  Yes Sable Feil, PA-C  ?Blood Glucose Monitoring Suppl w/Device KIT Use to check blood sugar as instructed 07/13/19  Yes Jan Fireman, PA-C  ?glucose blood test strip Use as instructed 07/13/19  Yes Jan Fireman, PA-C  ?ibuprofen (ADVIL) 800 MG tablet Take 1 tablet (800 mg total) by mouth every 8  (eight) hours as needed for mild pain. 05/28/21  Yes Ward, Delice Bison, DO  ?indomethacin (INDOCIN) 50 MG capsule Take 1 capsule (50 mg total) by mouth 2 (two) times daily as needed (use lowest effective dose, shortest effective duration- take with food when possible). 07/02/21  Yes Sable Feil, PA-C  ?Lancets (FREESTYLE) lancets Use as instructed 07/13/19  Yes Jan Fireman, PA-C  ?lidocaine (LIDODERM) 5 % Place 1 patch onto the skin daily for 10 days. Remove & Discard patch within 12 hours or as directed by MD 09/09/21 09/19/21 Yes Rada Hay, MD  ?lisinopril-hydrochlorothiazide (ZESTORETIC) 20-12.5 MG tablet Take 2 tablets by mouth daily. 11/22/20  Yes Sable Feil, PA-C  ?metFORMIN (GLUCOPHAGE) 850 MG tablet TAKE 1 TABLET(850 MG) BY MOUTH TWICE DAILY WITH A MEAL 11/22/20  Yes Sable Feil, PA-C  ?ondansetron (ZOFRAN-ODT) 4 MG disintegrating tablet Take 1 tablet (4 mg total) by mouth every 6 (six) hours as needed for nausea or vomiting. 05/28/21  Yes Ward, Delice Bison, DO  ?oxyCODONE-acetaminophen (PERCOCET) 5-325 MG tablet Take 2 tablets by mouth every 6 (six) hours as needed for severe pain. 05/28/21 05/28/22 Yes Ward, Delice Bison, DO  ?tamsulosin (FLOMAX) 0.4 MG CAPS capsule Take 1 capsule (0.4 mg total) by mouth daily. 08/22/21  Yes Sable Feil, PA-C  ?icosapent Ethyl (VASCEPA) 1 g capsule Take 2 capsules (2 g total) by mouth 2 (two) times daily. 08/27/20 11/25/20  Apolonio Schneiders, [place]  ? ? ?Allergies ?Patient has no known allergies. ? ?History reviewed. No  pertinent family history. ? ?Social History ?Social History  ? ?Tobacco Use  ? Smoking status: Never  ? Smokeless tobacco: Never  ?Vaping Use  ? Vaping Use: Never used  ?Substance Use Topics  ? Alcohol use: Yes  ?  Comment: 2 drinks a week  ? Drug use: Not Currently  ? ? ?Review of Systems ? ?Constitutional: No fever/chills ?Eyes: No visual changes. ?ENT: No sore throat. ?Cardiovascular: Denies chest pain. ?Respiratory: Denies shortness of  breath. ?Gastrointestinal: No abdominal pain.  No nausea, no vomiting.  No diarrhea.  No constipation. ?Genitourinary: Negative for dysuria. ?Musculoskeletal: Positive for back pain. ?Skin: Negative for rash. ?Neurological: Negative for headaches, focal weakness or  ?Endocrine: Diabetes, gout, hyperlipidemia, and hypertension. ? ? ?____________________________________________ ? ? ?PHYSICAL EXAM: ? ?VITAL SIGNS: Temperature is 98, pulse 83, respiration 14, BP is 141/92, and patient 95% O2 sat on room air.  Patient weighs 290 pounds and BMI is 40.45. ?Constitutional: Alert and oriented. Well appearing and in no acute distress. ?Eyes: Conjunctivae are normal. PERRL. EOMI. ?Head: Atraumatic. ?Nose: No congestion/rhinnorhea. ?Mouth/Throat: Mucous membranes are moist.  Oropharynx non-erythematous. ?Neck: No stridor.  No cervical spine tenderness to palpation. ?Hematological/Lymphatic/Immunilogical: No cervical lymphadenopathy. ?}Cardiovascular: Normal rate, regular rhythm. Grossly normal heart sounds.  Good peripheral circulation. ?Respiratory: Normal respiratory effort.  No retractions. Lungs CTAB. ?Gastrointestinal: Soft and nontender.  Distention secondary to body habitus. No abdominal bruits. No CVA tenderness. ?Genitourinary:  ?Musculoskeletal: No lower extremity tenderness nor edema.  No joint effusions. ?Neurologic:  Normal speech and language. No gross focal neurologic deficits are appreciated. No gait instability. ?Skin:  Skin is warm, dry and intact. No rash noted. ?Psychiatric: Mood and affect are normal. Speech and behavior are normal. ? ?____________________________________________ ?  ?LABS ?          ?Component Ref Range & Units 6 d ago ?(09/10/21) 7 d ago ?(09/09/21) 3 mo ago ?(05/28/21) 1 yr ago ?(09/03/20) 1 yr ago ?(08/23/20) 1 yr ago ?(08/20/20) 2 yr ago ?(07/18/19)  ?Color, UA  yellow      yellow  Yellow   ?Clarity, UA  clear      clear  Clear   ?Glucose, UA Negative Negative      Negative  Negative    ?Bilirubin, UA  negative      negative  Negative   ?Ketones, UA  negative      negative  1+ CM   ?Spec Grav, UA 1.010 - 1.025 >=1.030 Abnormal       >=1.030 Abnormal   1.025   ?Blood, UA  negative      negative  Negative   ?pH, UA 5.0 - 8.0 5.5      5.0  5.5   ?Protein, UA Negative Negative      Negative  Negative   ?Urobilinogen, UA 0.2 or 1.0 E.U./dL 0.2      0.2  0.2   ?Nitrite, UA  negative      negative  Negative   ?Leukocytes, UA Negative Negative      Negative  Negative   ?Appearance   CLEAR Abnormal  R  CLEAR R, CM  HAZY Abnormal  R  CLOUDY Abnormal  R  medium     ?Odor            ?Resulting Agency   [facility]    ?  ? ?  ?  ?Specimen Collected: 09/10/21 09:32 Last Resulted: 09/10/21 09:32  ?  ?  Lab Flowsheet   ? Order Details   ? View Encounter   ? Lab and Collection Details   ? Routing   ? Result History    ?View Encounter Conversation    ?  ?CM=Additional comments  R=Reference range differs from displayed range    ?  ?Result Care Coordination ? ? ?Patient Communication ? ? Add Comments   Seen Back to Top  ?  ?  ? ?Other Results from 09/10/2021 ? ? Contains abnormal data CMP12+LP+TP+TSH+6AC+PSA+CBC? ?Order: [id number] ?Status: Final result    ?Visible to patient: Yes (seen)    ?Next appt: None    ?Dx: Routine adult health maintenance    ?0 Result Notes ?          ?Component Ref Range & Units 6 d ago ?(09/10/21) 7 d ago ?(09/09/21) 7 d ago ?(09/09/21) 3 mo ago ?(05/28/21) 3 mo ago ?(05/28/21) 1 yr ago ?(08/23/20) 1 yr ago ?(08/23/20)  ?Glucose 70 - 99 mg/dL 145 High   120 High  CM   258 High  CM   170 High  CM    ?Uric Acid 3.8 - 8.4 mg/dL 7.5         ?Comment:            Therapeutic target for gout patients: <6.0  ?BUN 6 - 24 mg/dL 25 High   26 High  R   25 High  R   29 High  R    ?Creatinine, Ser 0.76 - 1.27 mg/dL 0.89  1.15 R   1.00 R   1.14 R    ?eGFR >59 mL/min/1.73 100         ?BUN/Creatinine Ratio 9 - 20 28 High          ?Sodium 134 - 144 mmol/L 137  138 R   133 Low  R    136 R    ?Potassium 3.5 - 5.2 mmol/L 4.0  3.9 R   3.8 R   3.8 R    ?Chloride 96 - 106 mmol/L 100  101 R   102 R   102 R    ?Calcium 8.7 - 10.2 mg/dL 9.4  9.7 R   9.1 R   9.3 R    ?Phosphorus 2.8 - 4.1 mg/dL 4.8 High          ?Total Prot

## 2021-09-16 NOTE — Progress Notes (Signed)
Pt presents today for physical labs, will return to clinic for scheduled physical.  

## 2021-09-17 ENCOUNTER — Encounter: Payer: 59 | Admitting: Physician Assistant

## 2021-09-22 ENCOUNTER — Ambulatory Visit: Payer: Self-pay | Admitting: Physician Assistant

## 2021-09-22 ENCOUNTER — Encounter: Payer: Self-pay | Admitting: Physician Assistant

## 2021-09-22 DIAGNOSIS — S39012D Strain of muscle, fascia and tendon of lower back, subsequent encounter: Secondary | ICD-10-CM

## 2021-09-22 MED ORDER — ORPHENADRINE CITRATE ER 100 MG PO TB12: 100.0000 mg | ORAL_TABLET | Freq: Two times a day (BID) | ORAL | 0 refills | Status: DC

## 2021-09-22 MED ORDER — KETOROLAC TROMETHAMINE 10 MG PO TABS: 10.0000 mg | ORAL_TABLET | Freq: Four times a day (QID) | ORAL | 0 refills | Status: DC | PRN

## 2021-09-22 NOTE — Progress Notes (Signed)
? ?  Subjective: Lumbar strain  ? ? Patient ID: Ricardo Nelson, male    DOB: 07-24-1965, 56 y.o.   MRN: [id number] ? ?HPI ?Patient follow-up for low lumbar strain patient states he has made remarkable improvement with the addition of Toradol and Norflex.  States has not performed any strenuous activities on an abundance of caution.  Denies radicular component to back pain.  Denies bladder or bowel dysfunction.  Rates his pain as a 3/10 at this time.  Describes the pain as "achy". ? ? ?Review of Systems ?History of kidney stones, gout, and obesity. ?   ?Objective:  ? Physical Exam ? ?No obvious distress.  Normal gait and posture.  Sits and stands with minimal reliance on upper extremities.  Examination of back shows no obvious deformity.  No flank guarding.  Increased range of motion from previous exam.  Negative straight leg test. ? ? ?   ?Assessment & Plan: Lumbar strain.  ?Advised to continue Toradol and Norflex for the next 5 days.  Follow-up as necessary. ? ?

## 2021-09-22 NOTE — Progress Notes (Signed)
Pt requesting refill on ketorolac, orphenadrine. Pt stating medicine helped a lot! ?

## 2021-10-07 ENCOUNTER — Other Ambulatory Visit: Payer: Self-pay

## 2021-10-07 DIAGNOSIS — E119 Type 2 diabetes mellitus without complications: Secondary | ICD-10-CM

## 2021-10-07 DIAGNOSIS — I10 Essential (primary) hypertension: Secondary | ICD-10-CM

## 2021-10-07 MED ORDER — METFORMIN HCL 850 MG PO TABS: 850.0000 mg | ORAL_TABLET | Freq: Two times a day (BID) | ORAL | 3 refills | Status: DC

## 2021-10-07 MED ORDER — LISINOPRIL-HYDROCHLOROTHIAZIDE 20-12.5 MG PO TABS: 2.0000 | ORAL_TABLET | Freq: Every day | ORAL | 3 refills | Status: DC

## 2021-11-18 DIAGNOSIS — J40 Bronchitis, not specified as acute or chronic: Secondary | ICD-10-CM | POA: Diagnosis not present

## 2022-01-23 ENCOUNTER — Other Ambulatory Visit: Payer: Self-pay

## 2022-01-23 DIAGNOSIS — E7800 Pure hypercholesterolemia, unspecified: Secondary | ICD-10-CM

## 2022-01-23 MED ORDER — ATORVASTATIN CALCIUM 20 MG PO TABS: 20.0000 mg | ORAL_TABLET | Freq: Every day | ORAL | 3 refills | Status: DC

## 2022-02-26 IMAGING — CT CT RENAL STONE PROTOCOL
2 of 4 series · 16 of 46 positions shown, 18 images · non-contrast
Comparison: 08/23/2020, MRI 03/21/2007

CLINICAL DATA: Left flank pain

EXAM:
CT ABDOMEN AND PELVIS WITHOUT CONTRAST
TECHNIQUE: Multidetector CT imaging of the abdomen and pelvis was performed
following the standard protocol without IV contrast.

[Series 2: stone full standard · axial · 0.91mm/px · z∈[-968,-468]mm · 13 of 111 slices shown, 15 images]
[im 6/111  soft-tissue]
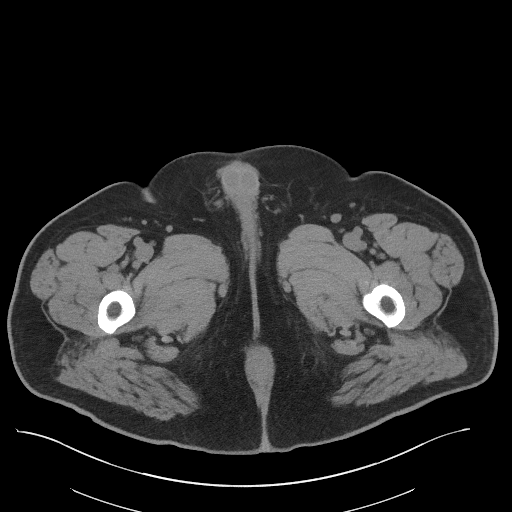
[im 6/111  bone]
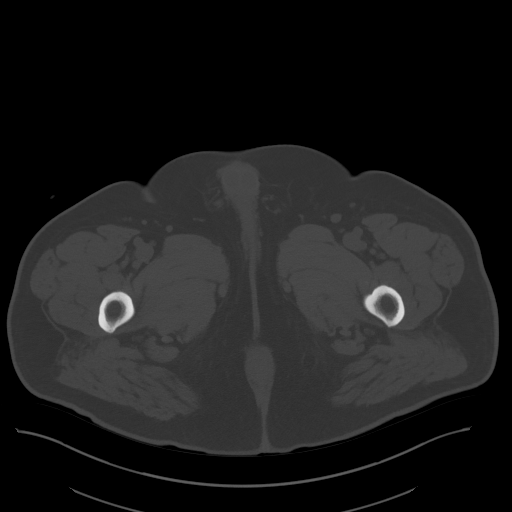
[im 16/111  soft-tissue]
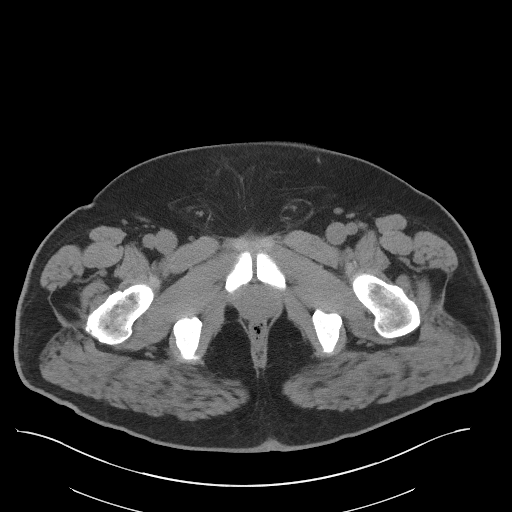
[im 26/111  soft-tissue]
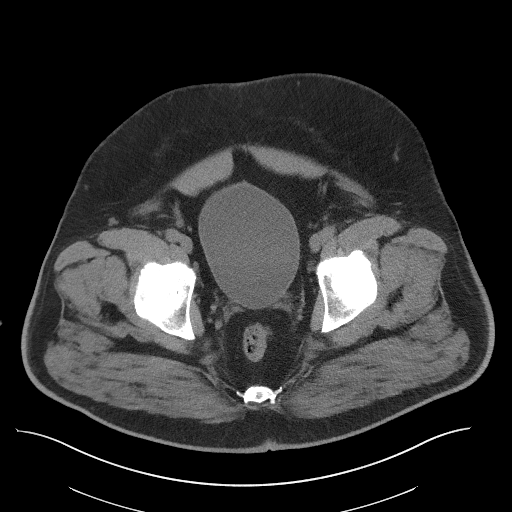
[im 31/111  soft-tissue]
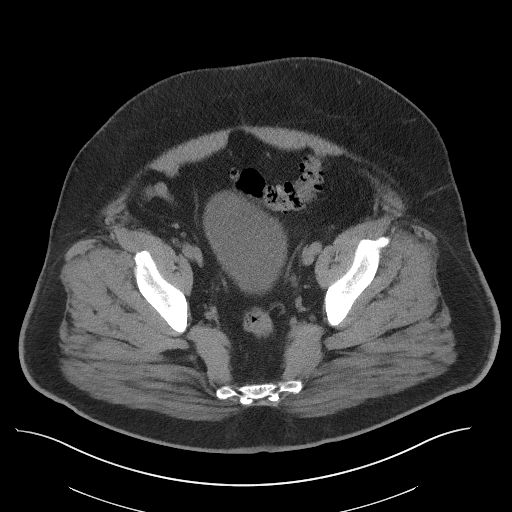
[im 41/111  soft-tissue]
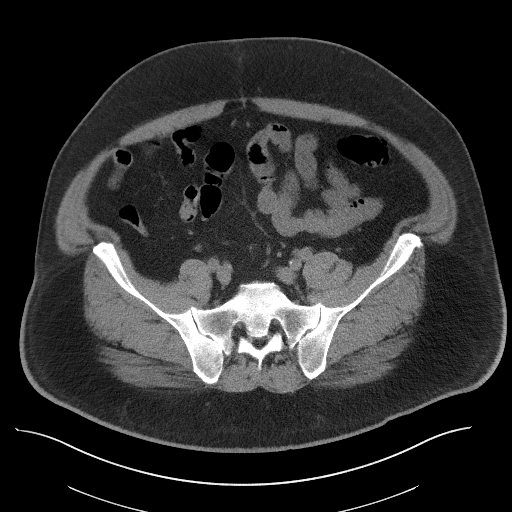
[im 46/111  soft-tissue]
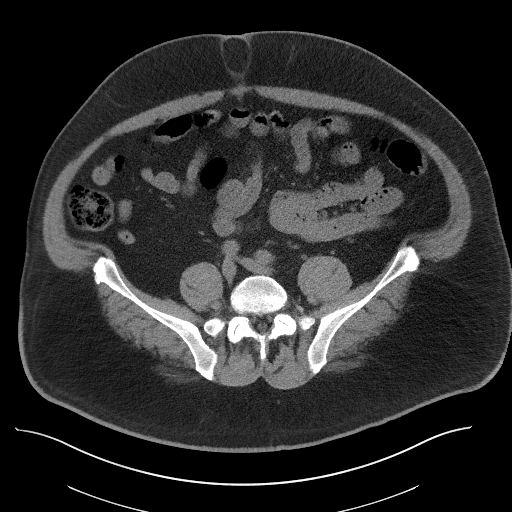
[im 56/111  soft-tissue]
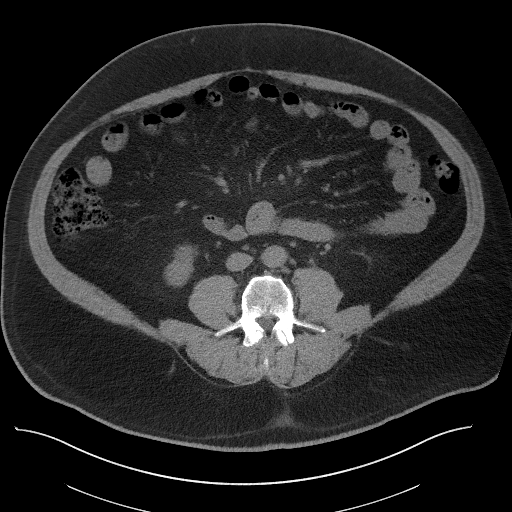
[im 66/111  soft-tissue]
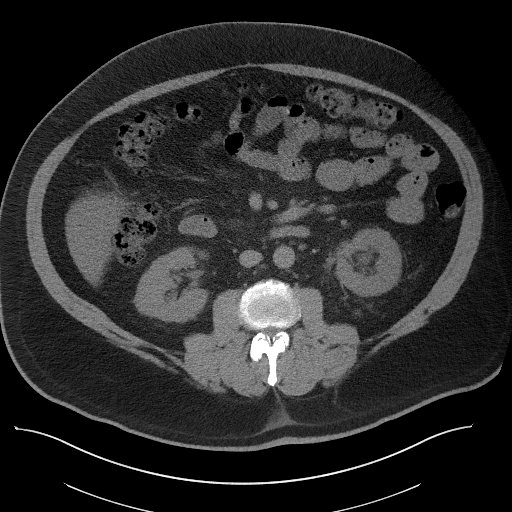
[im 71/111  soft-tissue]
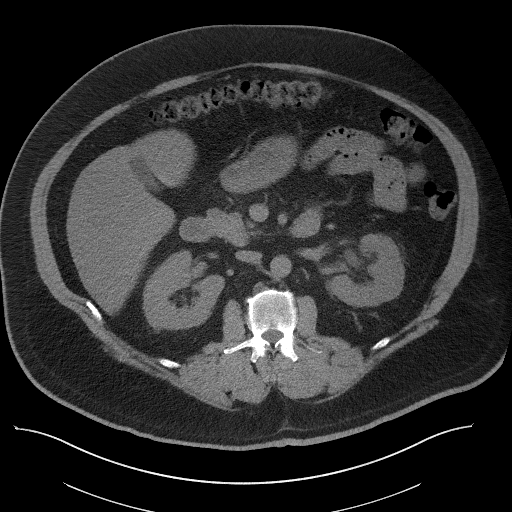
[im 71/111  bone]
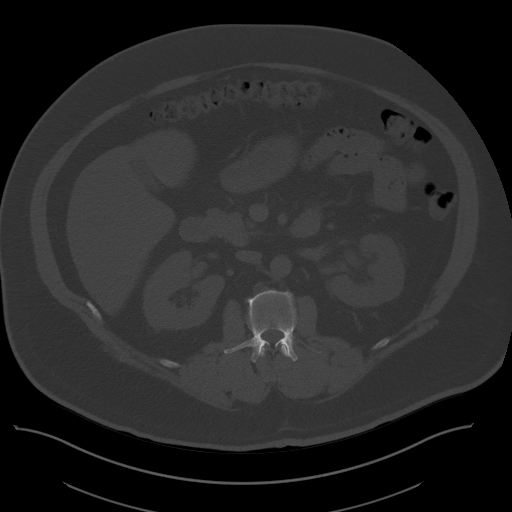
[im 81/111  soft-tissue]
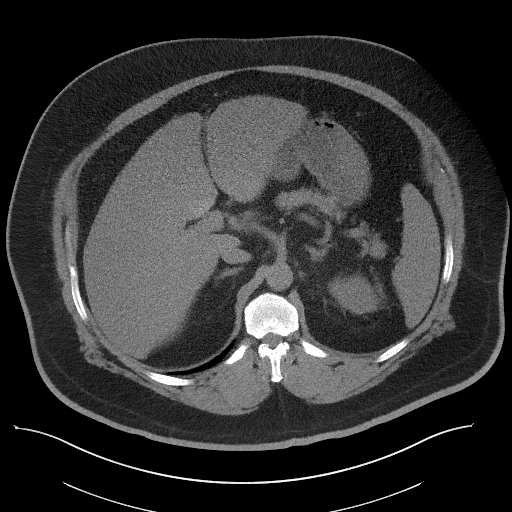
[im 86/111  soft-tissue]
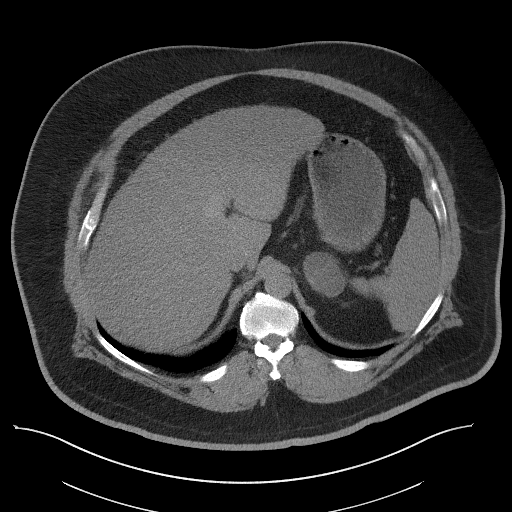
[im 96/111  soft-tissue]
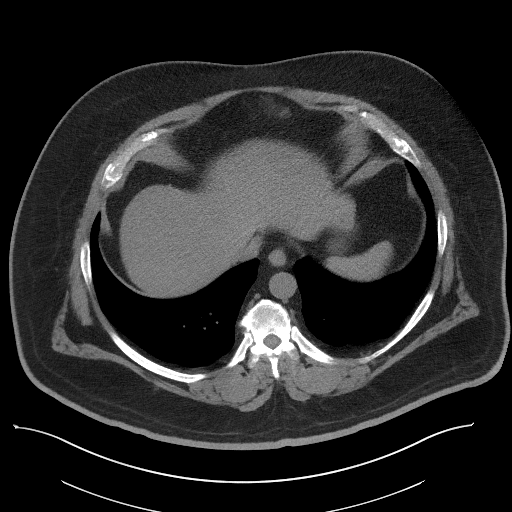
[im 106/111  soft-tissue]
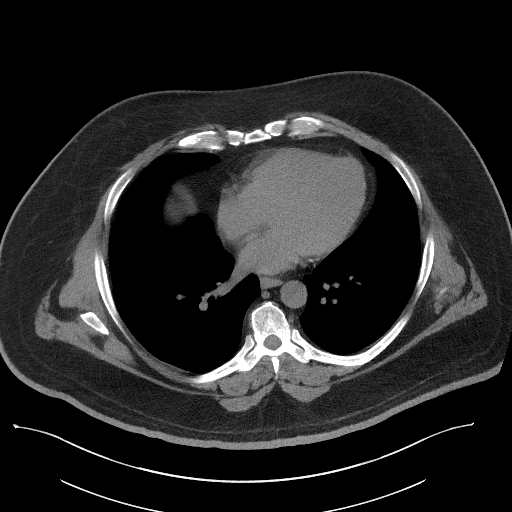

[Series 5: coronal · coronal · 1.08mm/px · 3 of 199 slices shown]
[im 67/199  soft-tissue]
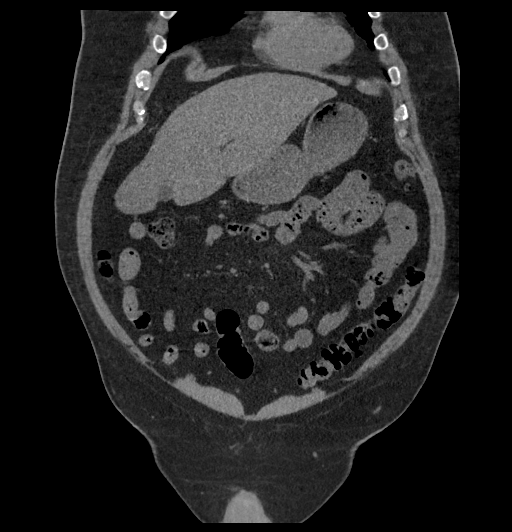
[im 89/199  soft-tissue]
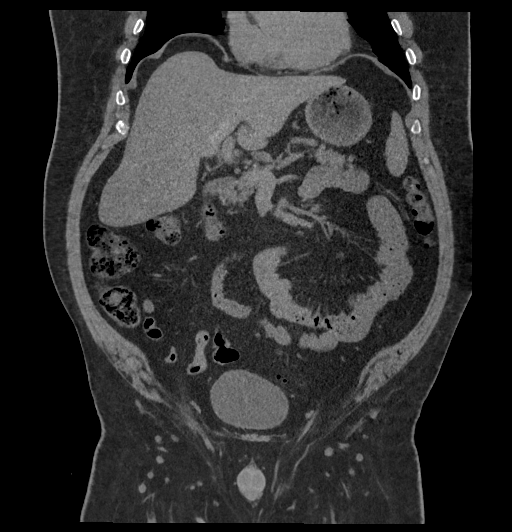
[im 111/199  soft-tissue]
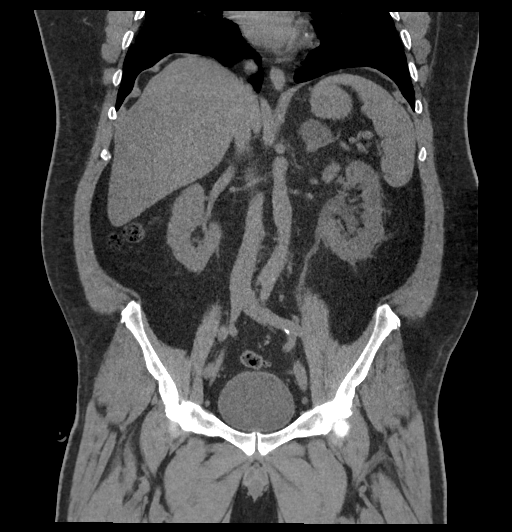

[16 of 46 positions shown; findings below may reference images not displayed]

FINDINGS: Lower chest: Visualized lung bases are clear bilaterally. The
visualized heart and pericardium are unremarkable.

Hepatobiliary: Mild hepatic steatosis with focal fatty sparing
within the gallbladder fossa. No intrahepatic mass identified on
this noncontrast examination. No intra or extrahepatic biliary
ductal dilation. Gallbladder unremarkable.

Pancreas: Unremarkable

Spleen: Unremarkable

Adrenals/Urinary Tract: Stable 4.5 cm left adrenal adenoma. Right
adrenal gland is unremarkable. Kidneys are normal in size and
position. Interval development of mild left hydronephrosis and
perinephric stranding secondary to an obstructing 3 mm calculus
within the distal left ureter which has migrated from the left
ureteropelvic junction on prior examination. No additional nephro or
urolithiasis. No hydronephrosis on the right. The bladder is
unremarkable.

Stomach/Bowel: Moderate descending and sigmoid colonic
diverticulosis. The stomach, small bowel, and large bowel are
otherwise unremarkable. Appendix absent. Partially calcified soft
tissue mass within the subxiphoid region is unchanged in keeping
with an area of fat necrosis, possibly the sequela of remote trauma.

Vascular/Lymphatic: Aortic atherosclerosis. No enlarged abdominal or
pelvic lymph nodes.

Reproductive: Prostate gland is unremarkable.

Other: Small fat containing umbilical hernia and bilateral inguinal
hernias

Musculoskeletal: No acute bone abnormality.
IMPRESSION: Antegrade migration of a 3 mm ureteral calculus now within the
distal left ureter just proximal to the ureteropelvic junction.
Persistent mild left hydronephrosis and perinephric stranding.

Mild hepatic steatosis.

Stable 4.5 cm left adrenal adenoma.

Moderate distal colonic diverticulosis without superimposed acute
inflammatory change.

Aortic Atherosclerosis ([id number]-3S0.0).

## 2022-06-10 IMAGING — CT CT RENAL STONE PROTOCOL
2 of 4 series · 16 of 46 positions shown, 18 images · non-contrast
Comparison: 05/28/2021

CLINICAL DATA: Right flank pain, nephrolithiasis



[Series 2: ap without · axial · non-contrast · 0.97mm/px · z∈[-977,-532]mm · 13 of 101 slices shown, 15 images]
[im 6/101  soft-tissue]
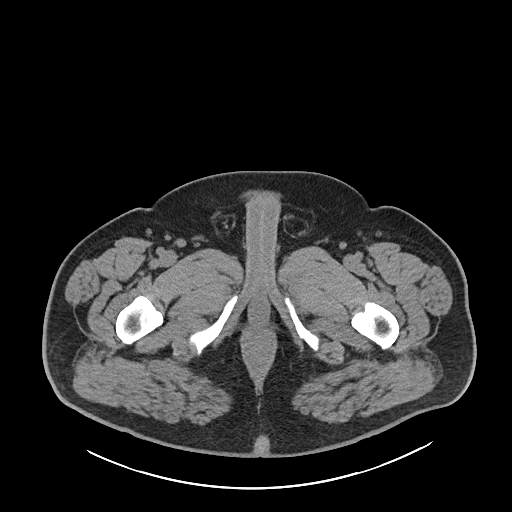
[im 6/101  bone]
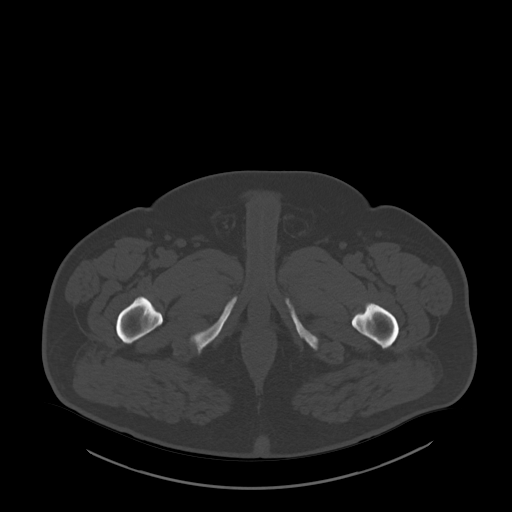
[im 12/101  soft-tissue]
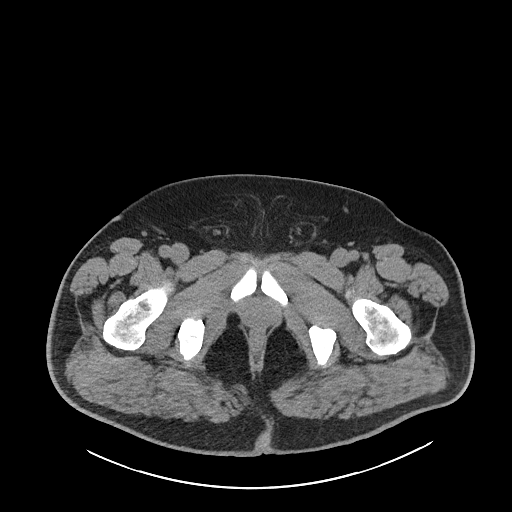
[im 23/101  soft-tissue]
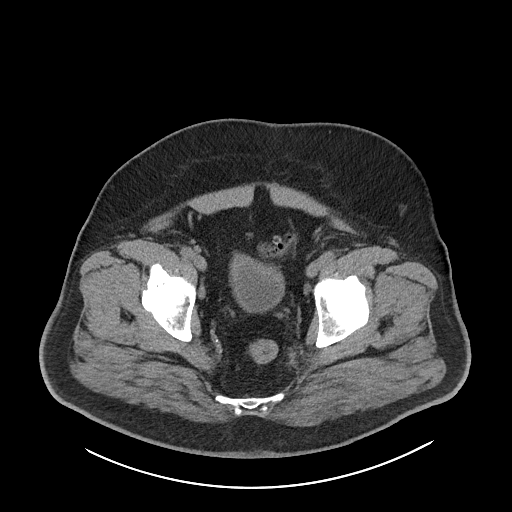
[im 28/101  soft-tissue]
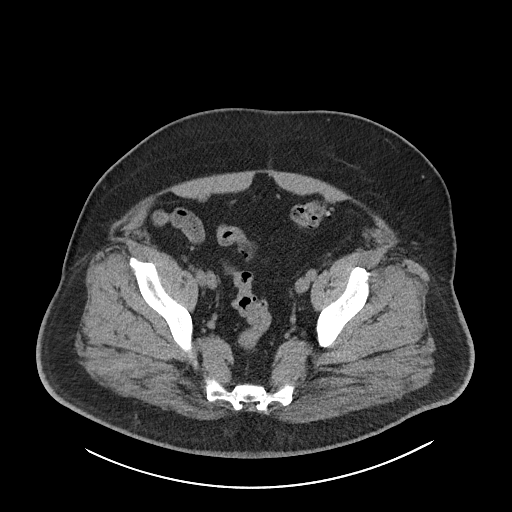
[im 34/101  soft-tissue]
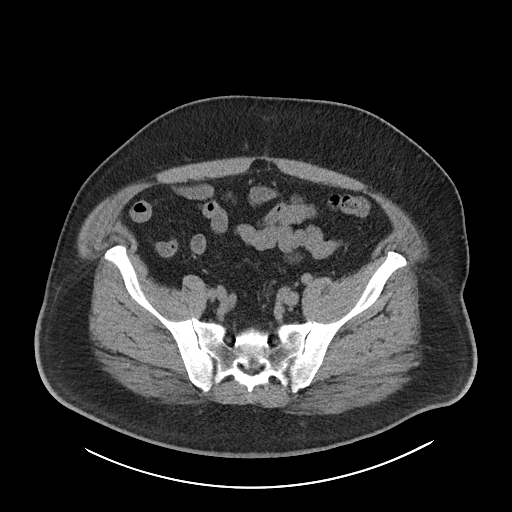
[im 45/101  soft-tissue]
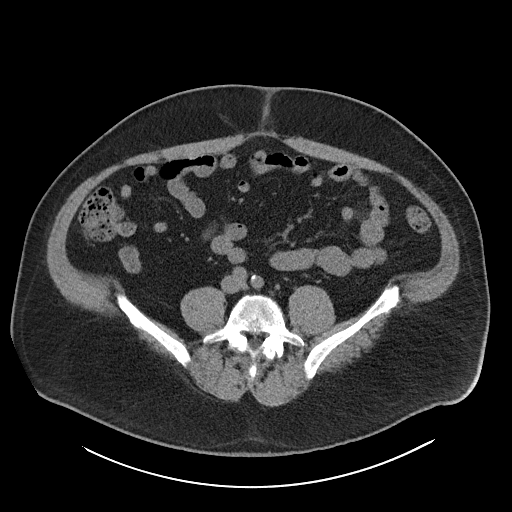
[im 51/101  soft-tissue]
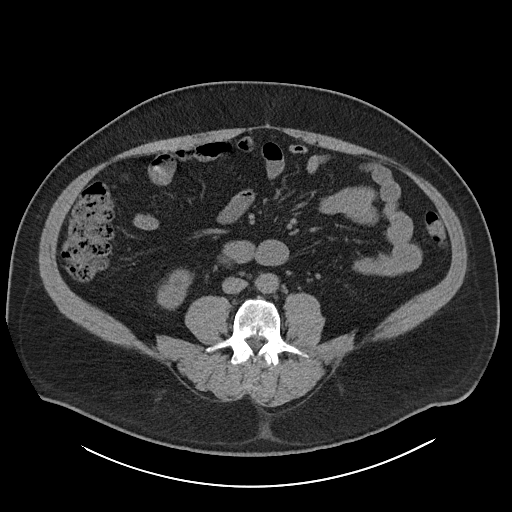
[im 56/101  soft-tissue]
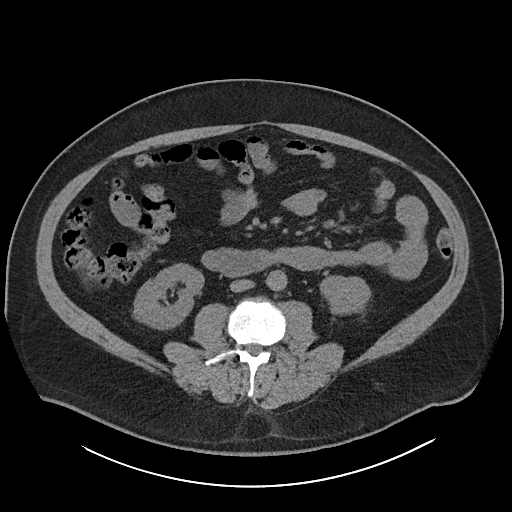
[im 67/101  soft-tissue]
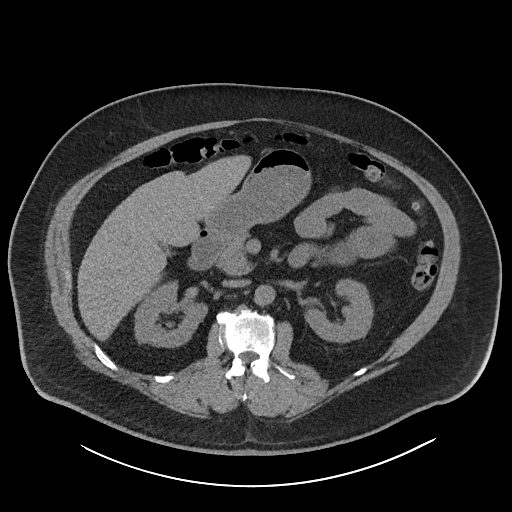
[im 67/101  bone]
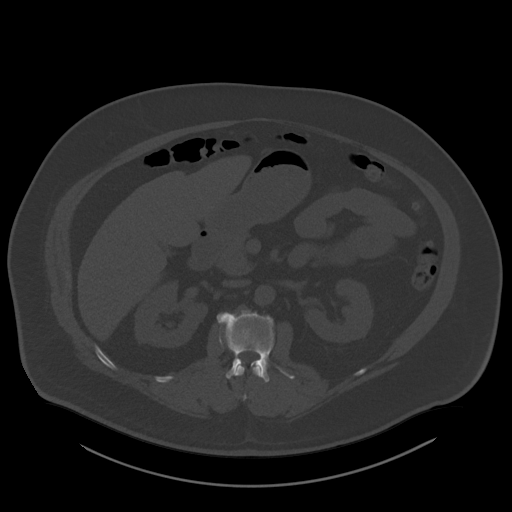
[im 73/101  soft-tissue]
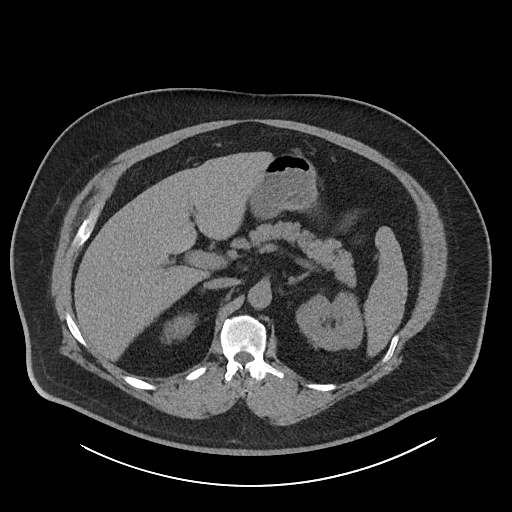
[im 78/101  soft-tissue]
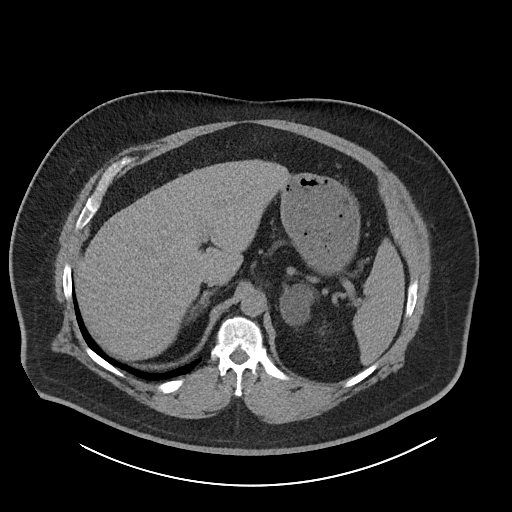
[im 89/101  soft-tissue]
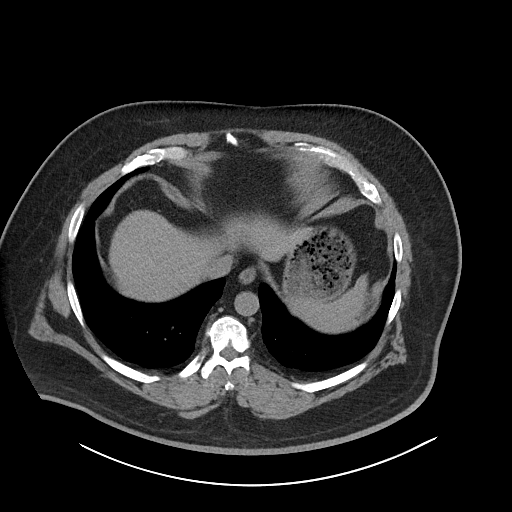
[im 95/101  soft-tissue]
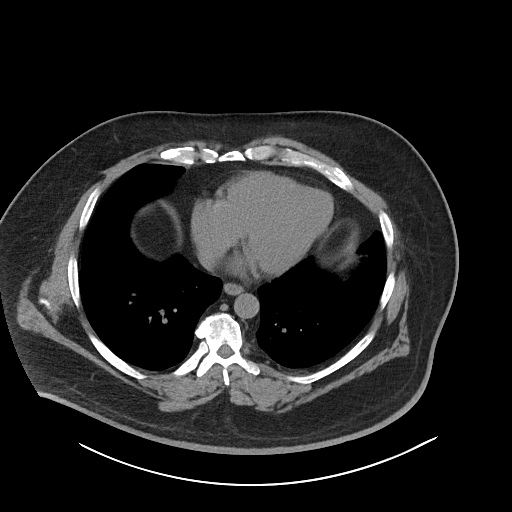

[Series 5: cor · coronal · 0.95mm/px · 3 of 105 slices shown]
[im 35/105  soft-tissue]
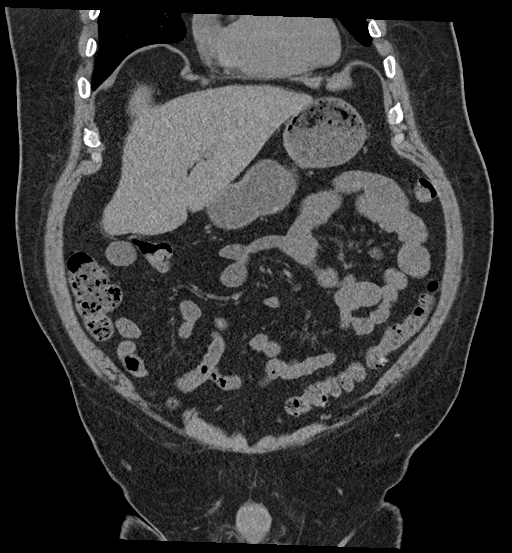
[im 47/105  soft-tissue]
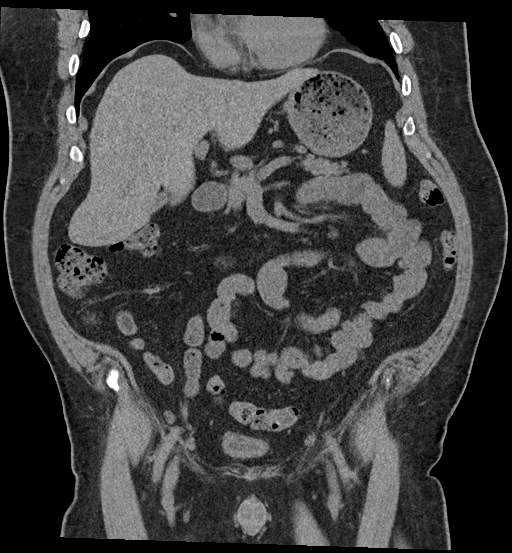
[im 58/105  soft-tissue]
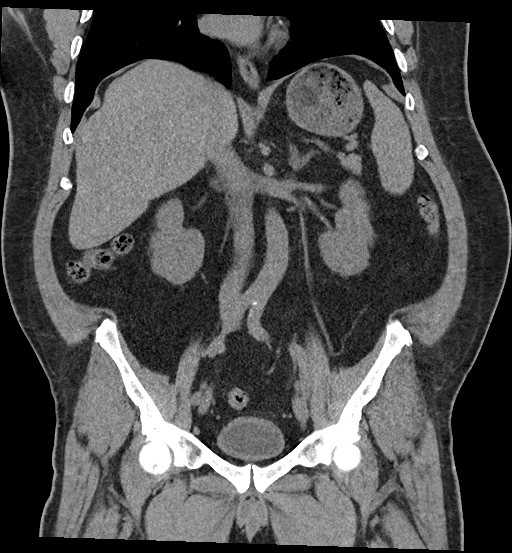

[16 of 46 positions shown; findings below may reference images not displayed]

FINDINGS: Lower chest: No acute abnormality.

Hepatobiliary: No focal liver abnormality is seen. No gallstones,
gallbladder wall thickening, or biliary dilatation.

Pancreas: Unremarkable

Spleen: Unremarkable

Adrenals/Urinary Tract: Stable 4.6 cm benign left adrenal adenoma.
Right adrenal gland is unremarkable. The kidneys are normal in size
and position. Previously noted left ureteral calculus is no longer
visualized and left-sided hydronephrosis has resolved. No residual
nephro or urolithiasis. No hydronephrosis. No significant
perinephric stranding or fluid collections are identified. The
bladder is unremarkable.

Stomach/Bowel: Moderate descending and sigmoid colonic
diverticulosis without superimposed acute inflammatory change. The
stomach, small bowel, and large bowel are otherwise unremarkable.
Appendix absent. No free intraperitoneal gas or fluid. Stable
partially calcified mass within the epigastrium anterior to the left
hepatic lobe most in keeping with a focus of fat necrosis.

Vascular/Lymphatic: Aortic atherosclerosis. No enlarged abdominal or
pelvic lymph nodes.

Reproductive: Prostate is unremarkable.

Other: Small fat containing umbilical and tiny bilateral fat
containing inguinal hernias.

Musculoskeletal: No acute bone abnormality. No lytic or blastic bone
lesion.
IMPRESSION: No acute intra-abdominal pathology identified. No definite
radiographic explanation for the patient's reported symptoms.

Interval passage of left ureteral calculus. No residual nephro or
urolithiasis. No hydronephrosis.

Stable 4.6 cm left adrenal adenoma.

Moderate distal colonic diverticulosis without superimposed acute
inflammatory change.

Aortic Atherosclerosis ([id number].R).

## 2022-06-11 DIAGNOSIS — J324 Chronic pansinusitis: Secondary | ICD-10-CM | POA: Diagnosis not present

## 2022-06-24 ENCOUNTER — Other Ambulatory Visit: Payer: Self-pay | Admitting: Physician Assistant

## 2022-06-24 DIAGNOSIS — Z1152 Encounter for screening for COVID-19: Secondary | ICD-10-CM

## 2022-06-24 LAB — POC COVID19 BINAXNOW: SARS Coronavirus 2 Ag: POSITIVE — AB

## 2022-06-24 LAB — POCT INFLUENZA A/B
Influenza A, POC: NEGATIVE
Influenza B, POC: NEGATIVE

## 2022-06-24 MED ORDER — IBUPROFEN 800 MG PO TABS: 800.0000 mg | ORAL_TABLET | Freq: Three times a day (TID) | ORAL | 0 refills | Status: DC | PRN

## 2022-06-24 MED ORDER — NIRMATRELVIR/RITONAVIR (PAXLOVID)TABLET: 3.0000 | ORAL_TABLET | Freq: Two times a day (BID) | ORAL | 0 refills | Status: AC

## 2022-06-24 MED ORDER — PROMETHAZINE-DM 6.25-15 MG/5ML PO SYRP: 5.0000 mL | ORAL_SOLUTION | Freq: Four times a day (QID) | ORAL | 0 refills | Status: DC | PRN

## 2022-06-24 NOTE — Progress Notes (Signed)
Symptoms started 06/22/22 Pt is positive for covid with cough body aches and fever.

## 2022-06-24 NOTE — Progress Notes (Signed)
   Subjective: Cough and bodyaches    Patient ID: Ricardo Nelson, male    DOB: June 04, 1966, 57 y.o.   MRN: [id number]  HPI Patient complaining of cough, body aches, and fatigue.  Denies fever/chills associated complaint.  Denies nausea, vomiting, diarrhea.  No recent travel or known contact with COVID-19.  Patient is not taking the flu or COVID vaccines this season.  Today patient tested positive for COVID-19.  Review of Systems Gout    Objective:   Physical Exam  Deferred secondary to telephonic encounter.      Assessment & Plan: COVID-19   Patient given a prescription for Paxlovid, Phenergan DM, and ibuprofen.  Patient advised to quarantine per CDC recommendations.

## 2022-10-13 ENCOUNTER — Ambulatory Visit: Payer: Self-pay | Admitting: Physician Assistant

## 2022-10-13 ENCOUNTER — Encounter: Payer: Self-pay | Admitting: Physician Assistant

## 2022-10-13 VITALS — BP 121/83 | HR 86 | Temp 97.7°F | Resp 14 | Ht 70.0 in | Wt 294.0 lb

## 2022-10-13 DIAGNOSIS — J301 Allergic rhinitis due to pollen: Secondary | ICD-10-CM

## 2022-10-13 MED ORDER — AMOXICILLIN 875 MG PO TABS: 875.0000 mg | ORAL_TABLET | Freq: Two times a day (BID) | ORAL | 0 refills | Status: AC

## 2022-10-13 MED ORDER — METHYLPREDNISOLONE 4 MG PO TBPK: ORAL_TABLET | ORAL | 0 refills | Status: DC

## 2022-10-13 MED ORDER — FEXOFENADINE-PSEUDOEPHED ER 60-120 MG PO TB12: 1.0000 | ORAL_TABLET | Freq: Two times a day (BID) | ORAL | 0 refills | Status: DC

## 2022-10-13 MED ORDER — TRIAMCINOLONE ACETONIDE 40 MG/ML IJ SUSP
40.0000 mg | Freq: Once | INTRAMUSCULAR | Status: AC
  Administered 2022-10-13: 40 mg via INTRAMUSCULAR

## 2022-10-13 NOTE — Progress Notes (Signed)
   Subjective: Allergic rhinitis    Patient ID: Ricardo Nelson, male    DOB: 10-27-1965, 57 y.o.   MRN: [id number]  HPI Patient complain of sinus congestion and left facial pain for 2 weeks.  Patient also complain of upper left molar teeth pain.  Onset of complaint after pain 2 days of golf and very windy and pollen exposure.  States mild transient relief with over-the-counter preparations.  Denies recent travel or known contact with COVID-19.  Denies fevers/chills associated complaint.   Review of Systems Gout    Objective:   Physical Exam BP 121/83  Pulse 86  Resp 14  Temp 97.7 F (36.5 C)  Temp src Temporal  SpO2 98 %  Weight 294 lb (133.4 kg)  Height  (1.778 m)  HEENT remarkable edematous left nasal turbinates and left maxillary guarding.  Left ear is slightly edematous but not erythematous.  Copious postnasal drainage. Neck is supple followed lymphadenopathy or bruits. Lungs clear to auscultation. Heart regular rate and rhythm.       Assessment & Plan: Subacute maxillary sinusitis  Patient has subacute maxillary sinusitis secondary to allergic rhinitis.  Patient given 40 mg of Solu-Medrol IM followed by Medrol Dosepak.  Patient also has prescription for amoxicillin and Allegra-D.  Patient to follow-up in improvement in 1 week.

## 2022-10-13 NOTE — Progress Notes (Signed)
Pt presents today with congestion/cough pressure in left  ear/face with jaw pain.

## 2022-11-11 ENCOUNTER — Other Ambulatory Visit: Payer: Self-pay

## 2022-11-11 DIAGNOSIS — E119 Type 2 diabetes mellitus without complications: Secondary | ICD-10-CM

## 2022-11-12 ENCOUNTER — Other Ambulatory Visit: Payer: Self-pay

## 2022-11-12 DIAGNOSIS — E119 Type 2 diabetes mellitus without complications: Secondary | ICD-10-CM

## 2022-11-12 DIAGNOSIS — I10 Essential (primary) hypertension: Secondary | ICD-10-CM

## 2022-11-12 MED ORDER — LISINOPRIL-HYDROCHLOROTHIAZIDE 20-12.5 MG PO TABS: 2.0000 | ORAL_TABLET | Freq: Every day | ORAL | 3 refills | Status: DC

## 2022-11-12 MED ORDER — METFORMIN HCL 850 MG PO TABS: 850.0000 mg | ORAL_TABLET | Freq: Two times a day (BID) | ORAL | 3 refills | Status: DC

## 2022-11-27 ENCOUNTER — Ambulatory Visit: Payer: 59

## 2022-11-27 DIAGNOSIS — Z0000 Encounter for general adult medical examination without abnormal findings: Secondary | ICD-10-CM

## 2022-11-27 LAB — POCT URINALYSIS DIPSTICK
Bilirubin, UA: NEGATIVE
Blood, UA: NEGATIVE
Glucose, UA: POSITIVE — AB
Ketones, UA: NEGATIVE
Leukocytes, UA: NEGATIVE
Nitrite, UA: NEGATIVE
Protein, UA: NEGATIVE
Spec Grav, UA: 1.025 (ref 1.010–1.025)
Urobilinogen, UA: 0.2 E.U./dL
pH, UA: 6 (ref 5.0–8.0)

## 2022-11-27 NOTE — Progress Notes (Signed)
Pt completed labs for annual physical. Ricardo Nelson

## 2022-11-29 LAB — CMP12+LP+TP+TSH+6AC+PSA+CBC…
ALT: 32 IU/L (ref 0–44)
AST: 19 IU/L (ref 0–40)
Albumin/Globulin Ratio: 1.6 (ref 1.2–2.2)
Albumin: 4.1 g/dL (ref 3.8–4.9)
Alkaline Phosphatase: 52 IU/L (ref 44–121)
BUN/Creatinine Ratio: 20 (ref 9–20)
BUN: 18 mg/dL (ref 6–24)
Basophils Absolute: 0 10*3/uL (ref 0.0–0.2)
Basos: 1 %
Bilirubin Total: 0.6 mg/dL (ref 0.0–1.2)
Calcium: 9.6 mg/dL (ref 8.7–10.2)
Chloride: 101 mmol/L (ref 96–106)
Chol/HDL Ratio: 3.5 ratio (ref 0.0–5.0)
Cholesterol, Total: 160 mg/dL (ref 100–199)
Creatinine, Ser: 0.9 mg/dL (ref 0.76–1.27)
EOS (ABSOLUTE): 0.1 10*3/uL (ref 0.0–0.4)
Eos: 1 %
Estimated CHD Risk: 0.6 times avg. (ref 0.0–1.0)
Free Thyroxine Index: 1.7 (ref 1.2–4.9)
GGT: 29 IU/L (ref 0–65)
Globulin, Total: 2.5 g/dL (ref 1.5–4.5)
Glucose: 192 mg/dL — ABNORMAL HIGH (ref 70–99)
HDL: 46 mg/dL (ref >39)
Hematocrit: 47 % (ref 37.5–51.0)
Hemoglobin: 16 g/dL (ref 13.0–17.7)
Immature Grans (Abs): 0 10*3/uL (ref 0.0–0.1)
Immature Granulocytes: 1 %
Iron: 102 ug/dL (ref 38–169)
LDH: 188 IU/L (ref 121–224)
LDL Chol Calc (NIH): 92 mg/dL (ref 0–99)
Lymphocytes Absolute: 2 10*3/uL (ref 0.7–3.1)
Lymphs: 31 %
MCH: 29.6 pg (ref 26.6–33.0)
MCHC: 34 g/dL (ref 31.5–35.7)
MCV: 87 fL (ref 79–97)
Monocytes Absolute: 0.7 10*3/uL (ref 0.1–0.9)
Monocytes: 11 %
Neutrophils Absolute: 3.6 10*3/uL (ref 1.4–7.0)
Neutrophils: 55 %
Phosphorus: 3.7 mg/dL (ref 2.8–4.1)
Platelets: 263 10*3/uL (ref 150–450)
Potassium: 4.4 mmol/L (ref 3.5–5.2)
Prostate Specific Ag, Serum: 0.8 ng/mL (ref 0.0–4.0)
RBC: 5.4 x10E6/uL (ref 4.14–5.80)
RDW: 13.3 % (ref 11.6–15.4)
Sodium: 139 mmol/L (ref 134–144)
T3 Uptake Ratio: 28 % (ref 24–39)
T4, Total: 5.9 ug/dL (ref 4.5–12.0)
TSH: 1.6 u[IU]/mL (ref 0.450–4.500)
Total Protein: 6.6 g/dL (ref 6.0–8.5)
Triglycerides: 122 mg/dL (ref 0–149)
Uric Acid: 7.1 mg/dL (ref 3.8–8.4)
VLDL Cholesterol Cal: 22 mg/dL (ref 5–40)
WBC: 6.4 10*3/uL (ref 3.4–10.8)
eGFR: 99 mL/min/{1.73_m2} (ref >59)

## 2022-11-29 LAB — MICROALBUMIN / CREATININE URINE RATIO
Creatinine, Urine: 132.4 mg/dL
Microalb/Creat Ratio: 8 mg/g creat (ref 0–29)
Microalbumin, Urine: 11 ug/mL

## 2022-11-29 LAB — HGB A1C W/O EAG: Hgb A1c MFr Bld: 10.4 % — ABNORMAL HIGH (ref 4.8–5.6)

## 2022-12-01 ENCOUNTER — Encounter: Payer: Self-pay | Admitting: Adult Health

## 2022-12-01 ENCOUNTER — Ambulatory Visit: Payer: Self-pay | Admitting: Adult Health

## 2022-12-01 VITALS — BP 114/79 | HR 112 | Temp 97.7°F | Resp 16 | Wt 295.0 lb

## 2022-12-01 DIAGNOSIS — G4733 Obstructive sleep apnea (adult) (pediatric): Secondary | ICD-10-CM

## 2022-12-01 DIAGNOSIS — Z0000 Encounter for general adult medical examination without abnormal findings: Secondary | ICD-10-CM

## 2022-12-01 DIAGNOSIS — E119 Type 2 diabetes mellitus without complications: Secondary | ICD-10-CM

## 2022-12-01 MED ORDER — SEMAGLUTIDE (1 MG/DOSE) 4 MG/3ML ~~LOC~~ SOPN
PEN_INJECTOR | SUBCUTANEOUS | 2 refills | Status: DC
Start: 2022-12-01 — End: 2022-12-08

## 2022-12-01 NOTE — Progress Notes (Signed)
Pt presents today to complete physical. Pt denies any issues or concerns.

## 2022-12-01 NOTE — Progress Notes (Signed)
[facility] [address] [place], [place] [address]   Office Visit Note  Patient Name: Ricardo Nelson Date of Birth [id number]  Medical Record number [id number]  Date of Service: 12/01/2022  Chief Complaint  Patient presents with   Annual Exam     HPI Pt is here for annual exam.  Mr. Vidrio is a 57 year old obese man.  He works for the [employer] of [employer] at [employer] out at [employer].  He and his wife have been married for a year and a half and he has a grown stepson who is 46 years old.  Mr. Luedke has a history that includes diabetes, kidney stones, OSA, and gout.  He reports he is taking his medications but that his diet is not good. He does not have any specific complaints at this time.    Current Medication:  Outpatient Encounter Medications as of 12/01/2022  Medication Sig   atorvastatin (LIPITOR) 20 MG tablet Take 1 tablet (20 mg total) by mouth at bedtime.   Blood Glucose Monitoring Suppl w/Device KIT Use to check blood sugar as instructed   fexofenadine-pseudoephedrine (ALLEGRA-D) 60-120 MG 12 hr tablet Take 1 tablet by mouth 2 (two) times daily.   glucose blood test strip Use as instructed   Lancets (FREESTYLE) lancets Use as instructed   lisinopril-hydrochlorothiazide (ZESTORETIC) 20-12.5 MG tablet Take 2 tablets by mouth daily.   metFORMIN (GLUCOPHAGE) 850 MG tablet Take 1 tablet (850 mg total) by mouth 2 (two) times daily with a meal.   methylPREDNISolone (MEDROL DOSEPAK) 4 MG TBPK tablet Take Tapered dose as directed   Semaglutide, 1 MG/DOSE, 4 MG/3ML SOPN Start with 0.25mg  subcutaneous injection weekly.  Increase by 0.25mg  each week until reaching 1mg  weekly.  If side effects occur, may stay at current does for a second week. May continue to increase dose by 0.25mg  until max dose of 2mg  weekly.   No facility-administered encounter medications on file as of 12/01/2022.      Medical History: Past Medical History:  Diagnosis Date   Diabetes  mellitus without complication (HCC)    Gout    Hyperlipemia    Hypertension      Vital Signs: BP 114/79   Pulse (!) 112   Temp 97.7 F (36.5 C) (Temporal)   Resp 16   Wt 295 lb (133.8 kg)   SpO2 95%   BMI 42.33 kg/m    Review of Systems  Constitutional:  Negative for chills, fatigue and fever.  HENT:  Negative for congestion, rhinorrhea and sore throat.   Respiratory:  Negative for cough and shortness of breath.   Cardiovascular:  Positive for leg swelling. Negative for chest pain and palpitations.  Gastrointestinal:  Negative for diarrhea, nausea and vomiting.    Physical Exam Vitals and nursing note reviewed.  Constitutional:      Appearance: Normal appearance.  HENT:     Head: Normocephalic.     Right Ear: Tympanic membrane and ear canal normal.     Left Ear: Tympanic membrane and ear canal normal.     Nose: Nose normal.     Mouth/Throat:     Mouth: Mucous membranes are moist.  Eyes:     Pupils: Pupils are equal, round, and reactive to light.  Pulmonary:     Effort: Pulmonary effort is normal.     Breath sounds: Normal breath sounds.  Abdominal:     Palpations: Abdomen is soft. There is no mass.     Tenderness:  There is no abdominal tenderness.     Hernia: No hernia is present.  Lymphadenopathy:     Cervical: No cervical adenopathy.  Neurological:     General: No focal deficit present.     Mental Status: He is alert and oriented to person, place, and time.    Assessment/Plan: 1. Routine adult health maintenance Discussed PHM.   2. Type 2 diabetes mellitus without complication, without long-term current use of insulin (HCC) Continue metformin as before.  Start semaglutide when available. HAve A1c checked 90 days after first dose. Continue diet and exercise plan as discussed in visit.  - Ambulatory referral to Endocrinology - Semaglutide, 1 MG/DOSE, 4 MG/3ML SOPN; Start with 0.25mg  subcutaneous injection weekly.  Increase by 0.25mg  each week until  reaching 1mg  weekly.  If side effects occur, may stay at current does for a second week. May continue to increase dose by 0.25mg  until max dose of 2mg  weekly.  Dispense: 9 mL; Refill: 2  3. OSA (obstructive sleep apnea) Non-compliant with cpap currently.  Discussed benefits of cpap use, with weight loss, sleep, cardiac benefits etc.  PT will try to start using again.      General Counseling: Sharalyn Ink understanding of the findings of todays visit and agrees with plan of treatment. I have discussed any further diagnostic evaluation that may be needed or ordered today. We also reviewed his medications today. he has been encouraged to call the office with any questions or concerns that should arise related to todays visit.   Orders Placed This Encounter  Procedures   Ambulatory referral to Endocrinology    Meds ordered this encounter  Medications   Semaglutide, 1 MG/DOSE, 4 MG/3ML SOPN    Sig: Start with 0.25mg  subcutaneous injection weekly.  Increase by 0.25mg  each week until reaching 1mg  weekly.  If side effects occur, may stay at current does for a second week. May continue to increase dose by 0.25mg  until max dose of 2mg  weekly.    Dispense:  9 mL    Refill:  2    Time spent:25 Minutes    Johnna Acosta AGNP-C Nurse Practitioner

## 2022-12-08 MED ORDER — OZEMPIC (0.25 OR 0.5 MG/DOSE) 2 MG/3ML ~~LOC~~ SOPN
PEN_INJECTOR | SUBCUTANEOUS | 0 refills | Status: DC
Start: 2022-12-08 — End: 2023-01-30

## 2022-12-08 NOTE — Addendum Note (Signed)
Addended by: Gardner Candle on: 12/08/2022 03:27 PM   Modules accepted: Orders

## 2022-12-08 NOTE — Addendum Note (Signed)
Addended by: Gardner Candle on: 12/08/2022 02:34 PM   Modules accepted: Orders

## 2022-12-09 NOTE — Addendum Note (Signed)
Addended by: Gardner Candle on: 12/09/2022 09:58 AM   Modules accepted: Orders

## 2022-12-09 NOTE — Addendum Note (Signed)
Addended by: Gardner Candle on: 12/09/2022 10:06 AM   Modules accepted: Orders

## 2022-12-21 ENCOUNTER — Other Ambulatory Visit: Payer: Self-pay | Admitting: Adult Health

## 2022-12-21 DIAGNOSIS — E119 Type 2 diabetes mellitus without complications: Secondary | ICD-10-CM

## 2023-01-30 ENCOUNTER — Other Ambulatory Visit: Payer: Self-pay | Admitting: Adult Health

## 2023-01-30 DIAGNOSIS — E119 Type 2 diabetes mellitus without complications: Secondary | ICD-10-CM

## 2023-02-01 ENCOUNTER — Other Ambulatory Visit: Payer: Self-pay

## 2023-02-01 DIAGNOSIS — E119 Type 2 diabetes mellitus without complications: Secondary | ICD-10-CM

## 2023-02-01 MED ORDER — OZEMPIC (0.25 OR 0.5 MG/DOSE) 2 MG/3ML ~~LOC~~ SOPN
PEN_INJECTOR | SUBCUTANEOUS | 0 refills | Status: DC
Start: 2023-02-01 — End: 2023-02-01

## 2023-02-01 MED ORDER — SEMAGLUTIDE (1 MG/DOSE) 4 MG/3ML ~~LOC~~ SOPN
1.0000 mg | PEN_INJECTOR | SUBCUTANEOUS | 0 refills | Status: DC
Start: 2023-02-01 — End: 2023-02-03

## 2023-02-03 ENCOUNTER — Other Ambulatory Visit: Payer: Self-pay | Admitting: Physician Assistant

## 2023-02-03 ENCOUNTER — Ambulatory Visit: Payer: Self-pay | Admitting: Physician Assistant

## 2023-02-03 ENCOUNTER — Encounter: Payer: Self-pay | Admitting: Physician Assistant

## 2023-02-03 VITALS — BP 133/97 | HR 89 | Temp 97.5°F | Resp 16 | Wt 296.2 lb

## 2023-02-03 DIAGNOSIS — Z760 Encounter for issue of repeat prescription: Secondary | ICD-10-CM

## 2023-02-03 MED ORDER — SEMAGLUTIDE (1 MG/DOSE) 4 MG/3ML ~~LOC~~ SOPN: 1.0000 mg | PEN_INJECTOR | SUBCUTANEOUS | 0 refills | Status: DC

## 2023-02-03 NOTE — Progress Notes (Signed)
Here to follow up with Ozempic use and DM.  Stated at first he had decreased appetite, but now it's back to normal.  He said his wife gives his injections and he eats what he wants to and really doesn't want to see endocrinology because they will "tell you what to do" and he is in charge of his life.  He stated he is non compliant with CPAP use, but says he knows he needs it as he doesn't sleep well or feel very rested.

## 2023-02-03 NOTE — Progress Notes (Signed)
   Subjective: Medication refill    Patient ID: Ricardo Nelson, male    DOB: Sep 24, 1965, 57 y.o.   MRN: [id number]  HPI Patient presents for medication refill for Ozempic.  Patient started the medication in June 2024.  Patient states the first month he noticed weight loss but this month he noticed a weight increase.  Patient also admits to noncompliance of taking metformin.  He was under the impression this medication replaces metformin.   Review of Systems Diabetes and gout    Objective:   Physical Exam BP 133/97  Pulse 89  Resp 16  Temp 97.5 F (36.4 C)  SpO2 97 %  Weight 296 lb 3.2 oz (134.4 kg)   BMI 42.50 kg/m2  BSA 2.58 m2  Exam deferred.       Assessment & Plan: Medication refill  Patient advised to continue metformin.  Prescription for Ozempic was written and patient will bring medication to clinic to be administered.

## 2023-02-15 ENCOUNTER — Other Ambulatory Visit: Payer: Self-pay | Admitting: Physician Assistant

## 2023-03-02 ENCOUNTER — Other Ambulatory Visit: Payer: Self-pay | Admitting: Physician Assistant

## 2023-03-02 DIAGNOSIS — E119 Type 2 diabetes mellitus without complications: Secondary | ICD-10-CM

## 2023-03-03 ENCOUNTER — Encounter (HOSPITAL_COMMUNITY): Payer: Self-pay

## 2023-03-03 ENCOUNTER — Other Ambulatory Visit (HOSPITAL_COMMUNITY): Payer: Self-pay

## 2023-03-03 ENCOUNTER — Other Ambulatory Visit: Payer: Self-pay

## 2023-03-03 MED ORDER — SEMAGLUTIDE (1 MG/DOSE) 4 MG/3ML ~~LOC~~ SOPN
1.0000 mg | PEN_INJECTOR | SUBCUTANEOUS | 0 refills | Status: DC
Start: 2023-03-03 — End: 2023-04-02
  Filled 2023-03-03 – 2023-03-10 (×2): qty 3, 28d supply, fill #0

## 2023-03-08 ENCOUNTER — Other Ambulatory Visit (HOSPITAL_COMMUNITY): Payer: Self-pay

## 2023-03-09 ENCOUNTER — Other Ambulatory Visit: Payer: Self-pay

## 2023-03-10 ENCOUNTER — Other Ambulatory Visit: Payer: Self-pay

## 2023-03-10 DIAGNOSIS — E7800 Pure hypercholesterolemia, unspecified: Secondary | ICD-10-CM

## 2023-03-10 MED ORDER — ATORVASTATIN CALCIUM 20 MG PO TABS
20.0000 mg | ORAL_TABLET | Freq: Every day | ORAL | 3 refills | Status: DC
Start: 2023-03-10 — End: 2023-05-10

## 2023-03-23 ENCOUNTER — Other Ambulatory Visit: Payer: Self-pay | Admitting: Physician Assistant

## 2023-03-23 DIAGNOSIS — E119 Type 2 diabetes mellitus without complications: Secondary | ICD-10-CM

## 2023-03-31 ENCOUNTER — Encounter (HOSPITAL_COMMUNITY): Payer: Self-pay

## 2023-03-31 ENCOUNTER — Encounter: Payer: Self-pay | Admitting: Physician Assistant

## 2023-04-01 ENCOUNTER — Other Ambulatory Visit (HOSPITAL_COMMUNITY): Payer: Self-pay

## 2023-04-01 ENCOUNTER — Other Ambulatory Visit: Payer: Self-pay | Admitting: Physician Assistant

## 2023-04-01 DIAGNOSIS — E119 Type 2 diabetes mellitus without complications: Secondary | ICD-10-CM

## 2023-04-02 ENCOUNTER — Other Ambulatory Visit: Payer: Self-pay

## 2023-04-02 ENCOUNTER — Other Ambulatory Visit: Payer: Self-pay | Admitting: Physician Assistant

## 2023-04-02 DIAGNOSIS — E119 Type 2 diabetes mellitus without complications: Secondary | ICD-10-CM

## 2023-04-02 MED ORDER — SEMAGLUTIDE (1 MG/DOSE) 4 MG/3ML ~~LOC~~ SOPN
1.0000 mg | PEN_INJECTOR | SUBCUTANEOUS | 0 refills | Status: DC
Start: 2023-04-02 — End: 2023-04-12

## 2023-04-12 ENCOUNTER — Other Ambulatory Visit: Payer: Self-pay | Admitting: Physician Assistant

## 2023-04-12 ENCOUNTER — Encounter: Payer: Self-pay | Admitting: Physician Assistant

## 2023-04-12 ENCOUNTER — Encounter: Payer: Self-pay | Admitting: Adult Health

## 2023-04-12 ENCOUNTER — Other Ambulatory Visit (HOSPITAL_COMMUNITY): Payer: Self-pay

## 2023-04-12 DIAGNOSIS — E119 Type 2 diabetes mellitus without complications: Secondary | ICD-10-CM

## 2023-04-12 MED ORDER — SEMAGLUTIDE (1 MG/DOSE) 4 MG/3ML ~~LOC~~ SOPN
1.0000 mg | PEN_INJECTOR | SUBCUTANEOUS | 0 refills | Status: DC
Start: 2023-04-12 — End: 2023-05-18

## 2023-05-10 ENCOUNTER — Other Ambulatory Visit: Payer: Self-pay

## 2023-05-10 DIAGNOSIS — E7800 Pure hypercholesterolemia, unspecified: Secondary | ICD-10-CM

## 2023-05-10 MED ORDER — ATORVASTATIN CALCIUM 20 MG PO TABS: 20.0000 mg | ORAL_TABLET | Freq: Every day | ORAL | 3 refills | Status: DC

## 2023-05-17 ENCOUNTER — Encounter: Payer: Self-pay | Admitting: Physician Assistant

## 2023-05-17 ENCOUNTER — Other Ambulatory Visit: Payer: Self-pay

## 2023-05-17 DIAGNOSIS — E7800 Pure hypercholesterolemia, unspecified: Secondary | ICD-10-CM

## 2023-05-17 MED ORDER — ATORVASTATIN CALCIUM 20 MG PO TABS: 20.0000 mg | ORAL_TABLET | Freq: Every day | ORAL | 3 refills | Status: DC

## 2023-05-18 ENCOUNTER — Other Ambulatory Visit: Payer: Self-pay

## 2023-05-18 ENCOUNTER — Other Ambulatory Visit: Payer: Self-pay | Admitting: Physician Assistant

## 2023-05-18 DIAGNOSIS — E119 Type 2 diabetes mellitus without complications: Secondary | ICD-10-CM

## 2023-05-18 MED ORDER — SEMAGLUTIDE (1 MG/DOSE) 4 MG/3ML ~~LOC~~ SOPN
1.0000 mg | PEN_INJECTOR | SUBCUTANEOUS | 0 refills | Status: DC
Start: 2023-05-18 — End: 2023-06-21

## 2023-06-18 ENCOUNTER — Other Ambulatory Visit: Payer: Self-pay

## 2023-06-18 DIAGNOSIS — E119 Type 2 diabetes mellitus without complications: Secondary | ICD-10-CM

## 2023-06-21 ENCOUNTER — Other Ambulatory Visit: Payer: Self-pay

## 2023-06-21 DIAGNOSIS — E119 Type 2 diabetes mellitus without complications: Secondary | ICD-10-CM

## 2023-06-21 MED ORDER — SEMAGLUTIDE (1 MG/DOSE) 4 MG/3ML ~~LOC~~ SOPN: 1.0000 mg | PEN_INJECTOR | SUBCUTANEOUS | 2 refills | Status: DC

## 2023-09-22 ENCOUNTER — Other Ambulatory Visit: Payer: Self-pay

## 2023-09-22 DIAGNOSIS — E119 Type 2 diabetes mellitus without complications: Secondary | ICD-10-CM

## 2023-09-22 MED ORDER — SEMAGLUTIDE (1 MG/DOSE) 4 MG/3ML ~~LOC~~ SOPN: 1.0000 mg | PEN_INJECTOR | SUBCUTANEOUS | 5 refills | Status: DC

## 2023-09-28 ENCOUNTER — Other Ambulatory Visit: Payer: Self-pay | Admitting: Physician Assistant

## 2023-09-28 DIAGNOSIS — I10 Essential (primary) hypertension: Secondary | ICD-10-CM

## 2023-12-01 ENCOUNTER — Ambulatory Visit: Payer: Self-pay

## 2023-12-01 DIAGNOSIS — Z0000 Encounter for general adult medical examination without abnormal findings: Secondary | ICD-10-CM

## 2023-12-01 DIAGNOSIS — E119 Type 2 diabetes mellitus without complications: Secondary | ICD-10-CM

## 2023-12-01 LAB — POCT URINALYSIS DIPSTICK
Bilirubin, UA: NEGATIVE
Blood, UA: NEGATIVE
Glucose, UA: NEGATIVE
Ketones, UA: NEGATIVE
Leukocytes, UA: NEGATIVE
Nitrite, UA: NEGATIVE
Protein, UA: NEGATIVE
Spec Grav, UA: 1.025 (ref 1.010–1.025)
Urobilinogen, UA: 0.2 U/dL
pH, UA: 5.5 (ref 5.0–8.0)

## 2023-12-02 LAB — CMP12+LP+TP+TSH+6AC+PSA+CBC…
ALT: 21 IU/L (ref 0–44)
AST: 19 IU/L (ref 0–40)
Albumin: 4.2 g/dL (ref 3.8–4.9)
Alkaline Phosphatase: 57 IU/L (ref 44–121)
BUN/Creatinine Ratio: 18 (ref 9–20)
BUN: 16 mg/dL (ref 6–24)
Basophils Absolute: 0.1 10*3/uL (ref 0.0–0.2)
Basos: 1 %
Bilirubin Total: 0.7 mg/dL (ref 0.0–1.2)
Calcium: 9.7 mg/dL (ref 8.7–10.2)
Chloride: 100 mmol/L (ref 96–106)
Chol/HDL Ratio: 4.4 ratio (ref 0.0–5.0)
Cholesterol, Total: 170 mg/dL (ref 100–199)
Creatinine, Ser: 0.91 mg/dL (ref 0.76–1.27)
EOS (ABSOLUTE): 0.3 10*3/uL (ref 0.0–0.4)
Eos: 4 %
Estimated CHD Risk: 0.9 times avg. (ref 0.0–1.0)
Free Thyroxine Index: 1.8 (ref 1.2–4.9)
GGT: 24 IU/L (ref 0–65)
Globulin, Total: 2.7 g/dL (ref 1.5–4.5)
Glucose: 158 mg/dL — ABNORMAL HIGH (ref 70–99)
HDL: 39 mg/dL — ABNORMAL LOW (ref >39)
Hematocrit: 50.8 % (ref 37.5–51.0)
Hemoglobin: 16.8 g/dL (ref 13.0–17.7)
Immature Grans (Abs): 0 10*3/uL (ref 0.0–0.1)
Immature Granulocytes: 0 %
Iron: 114 ug/dL (ref 38–169)
LDH: 187 IU/L (ref 121–224)
LDL Chol Calc (NIH): 102 mg/dL — ABNORMAL HIGH (ref 0–99)
Lymphocytes Absolute: 2.4 10*3/uL (ref 0.7–3.1)
Lymphs: 33 %
MCH: 29.4 pg (ref 26.6–33.0)
MCHC: 33.1 g/dL (ref 31.5–35.7)
MCV: 89 fL (ref 79–97)
Monocytes Absolute: 0.8 10*3/uL (ref 0.1–0.9)
Monocytes: 11 %
Neutrophils Absolute: 3.8 10*3/uL (ref 1.4–7.0)
Neutrophils: 51 %
Phosphorus: 3.6 mg/dL (ref 2.8–4.1)
Platelets: 262 10*3/uL (ref 150–450)
Potassium: 4.1 mmol/L (ref 3.5–5.2)
Prostate Specific Ag, Serum: 1 ng/mL (ref 0.0–4.0)
RBC: 5.71 x10E6/uL (ref 4.14–5.80)
RDW: 13.2 % (ref 11.6–15.4)
Sodium: 137 mmol/L (ref 134–144)
T3 Uptake Ratio: 29 % (ref 24–39)
T4, Total: 6.3 ug/dL (ref 4.5–12.0)
TSH: 1.12 u[IU]/mL (ref 0.450–4.500)
Total Protein: 6.9 g/dL (ref 6.0–8.5)
Triglycerides: 166 mg/dL — ABNORMAL HIGH (ref 0–149)
Uric Acid: 8.3 mg/dL (ref 3.8–8.4)
VLDL Cholesterol Cal: 29 mg/dL (ref 5–40)
WBC: 7.4 10*3/uL (ref 3.4–10.8)
eGFR: 97 mL/min/{1.73_m2} (ref >59)

## 2023-12-02 LAB — MICROALBUMIN / CREATININE URINE RATIO
Creatinine, Urine: 109.5 mg/dL
Microalb/Creat Ratio: 4 mg/g{creat} (ref 0–29)
Microalbumin, Urine: 4 ug/mL

## 2023-12-02 LAB — HGB A1C W/O EAG: Hgb A1c MFr Bld: 7.9 % — ABNORMAL HIGH (ref 4.8–5.6)

## 2023-12-08 ENCOUNTER — Encounter: Payer: Self-pay | Admitting: Physician Assistant

## 2023-12-08 ENCOUNTER — Ambulatory Visit: Payer: Self-pay | Admitting: Physician Assistant

## 2023-12-08 VITALS — BP 130/87 | HR 87 | Temp 97.8°F | Resp 16 | Ht 71.0 in | Wt 285.0 lb

## 2023-12-08 DIAGNOSIS — Z0000 Encounter for general adult medical examination without abnormal findings: Secondary | ICD-10-CM

## 2023-12-08 MED ORDER — ROSUVASTATIN CALCIUM 40 MG PO TABS: 40.0000 mg | ORAL_TABLET | Freq: Every day | ORAL | 3 refills | Status: AC

## 2023-12-08 MED ORDER — OZEMPIC (0.25 OR 0.5 MG/DOSE) 2 MG/3ML ~~LOC~~ SOPN: 2.0000 mg | PEN_INJECTOR | SUBCUTANEOUS | 3 refills | Status: DC

## 2023-12-08 NOTE — Progress Notes (Signed)
 [facility] ____________________________________________   None    (approximate)  I have reviewed the triage vital signs and the nursing notes.   HISTORY  Chief Complaint No chief complaint on file.   HPI Ricardo Nelson is a 58 y.o. male patient presents for annual physical exam.  Patient is aware that his hemoglobin A1c has decreased to 7.9.  A year ago was 10.4.  Patient was prescribed Ozempic  at 1 mg subcu and has noticed no weight change.  Patient also is aware that his lipid profile pertaining to his triglycerides has increased.  Patient admits to noncompliance of diet.  Patient states increased physical activity secondary to new hobby of gardening.         Past Medical History:  Diagnosis Date   Diabetes mellitus without complication (HCC)    Gout    Hyperlipemia    Hypertension     Patient Active Problem List   Diagnosis Date Noted   Gout 07/02/2021    Past Surgical History:  Procedure Laterality Date   APPENDECTOMY     CYSTOSCOPY/URETEROSCOPY/HOLMIUM LASER/STENT PLACEMENT Left 09/04/2020   Procedure: CYSTOSCOPY/URETEROSCOPY/HOLMIUM LASER/STENT PLACEMENT;  Surgeon: Lawerence Pressman, MD;  Location: [facility];  Service: Urology;  Laterality: Left;   TONSILLECTOMY      Prior to Admission medications   Medication Sig Start Date End Date Taking? Authorizing Provider  atorvastatin  (LIPITOR) 20 MG tablet Take 1 tablet (20 mg total) by mouth at bedtime. 05/17/23   Marcina Severe, PA-C  Blood Glucose Monitoring Suppl w/Device KIT Use to check blood sugar as instructed 07/13/19   Edward Graff, PA-C  glucose blood test strip Use as instructed 07/13/19   Edward Graff, PA-C  Lancets (FREESTYLE) lancets Use as instructed 07/13/19   Edward Graff, PA-C  lisinopril -hydrochlorothiazide  (ZESTORETIC ) 20-12.5 MG tablet TAKE TWO TABLETS EVERY DAY 09/28/23   Marcina Severe, PA-C  metFORMIN  (GLUCOPHAGE ) 850 MG tablet Take 1 tablet (850 mg total)  by mouth 2 (two) times daily with a meal. 11/12/22   Marcina Severe, PA-C  Semaglutide , 1 MG/DOSE, 4 MG/3ML SOPN Inject 1 mg as directed once a week. 09/22/23   Marcina Severe, PA-C    Allergies Patient has no known allergies.  No family history on file.  Social History Social History   Tobacco Use   Smoking status: Never   Smokeless tobacco: Never  Vaping Use   Vaping status: Never Used  Substance Use Topics   Alcohol use: Yes    Comment: 2 drinks a week   Drug use: Not Currently    Review of Systems Constitutional: No fever/chills Eyes: No visual changes. ENT: No sore throat. Cardiovascular: Denies chest pain. Respiratory: Denies shortness of breath. Gastrointestinal: No abdominal pain.  No nausea, no vomiting.  No diarrhea.  No constipation. Genitourinary: Negative for dysuria. Musculoskeletal: Negative for back pain. Skin: Negative for rash. Neurological: Negative for headaches, focal weakness or numbness. Endocrine: Diabetes, hyperlipidemia, and hypertension  ____________________________________________   PHYSICAL EXAM:  VITAL SIGNS: BP 130/87  Cuff Size Large  Pulse Rate 87  Temp 97.8 F (36.6 C)  Temp Source Temporal  Weight 285 lb (129.3 kg)  Height 5' 11 (1.803 m)  Resp 16  SpO2 98 %   BMI: 39.75 kg/m2  BSA: 2.55 m2   Constitutional: Alert and oriented. Well appearing and in no acute distress. Eyes: Conjunctivae are normal. PERRL. EOMI. Head: Atraumatic. Nose: No congestion/rhinnorhea. Mouth/Throat: Mucous membranes are moist.  Oropharynx non-erythematous. Neck: No stridor. No cervical spine tenderness to palpation. Hematological/Lymphatic/Immunilogical: No cervical lymphadenopathy. Cardiovascular: Normal rate, regular rhythm. Grossly normal heart sounds.  Good peripheral circulation. Respiratory: Normal respiratory effort.  No retractions. Lungs CTAB. Gastrointestinal: Soft and nontender.  Distention secondary to body habitus. No abdominal  bruits. No CVA tenderness. Genitourinary: Deferred Musculoskeletal: No lower extremity tenderness nor edema.  No joint effusions. Neurologic:  Normal speech and language. No gross focal neurologic deficits are appreciated. No gait instability. Skin:  Skin is warm, dry and intact. No rash noted. Psychiatric: Mood and affect are normal. Speech and behavior are normal.  ____________________________________________   LABS  0 Result Notes     1 HM Topic           Component Ref Range & Units (hover) 7 d ago (12/01/23) 1 yr ago (11/27/22) 2 yr ago (09/10/21) 2 yr ago (09/09/21) 2 yr ago (09/09/21) 2 yr ago (05/28/21) 2 yr ago (05/28/21)  Glucose 158 High  192 High  145 High  120 High  CM  258 High  CM   Uric Acid 8.3 7.1 CM 7.5 CM      Comment:            Therapeutic target for gout patients: <6.0  BUN 16 18 25  High  26 High  R  25 High  R   Creatinine, Ser 0.91 0.90 0.89 1.15 R  1.00 R   eGFR 97 99 100      BUN/Creatinine Ratio 18 20 28  High       Sodium 137 139 137 138 R  133 Low  R   Potassium 4.1 4.4 4.0 3.9 R  3.8 R   Chloride 100 101 100 101 R  102 R   Calcium  9.7 9.6 9.4 9.7 R  9.1 R   Phosphorus 3.6 3.7 4.8 High       Total Protein 6.9 6.6 6.5      Albumin 4.2 4.1 4.1      Globulin, Total 2.7 2.5 2.4      Bilirubin Total 0.7 0.6 0.5      Alkaline Phosphatase 57 52 49      LDH 187 188 150      AST 19 19 15       ALT 21 32 20      GGT 24 29 19       Iron 114 102 92      Cholesterol, Total 170 160 166      Triglycerides 166 High  122 144      HDL 39 Low  46 39 Low       VLDL Cholesterol Cal 29 22 26       LDL Chol Calc (NIH) 102 High  92 101 High       Chol/HDL Ratio 4.4 3.5 CM 4.3 CM      Comment:                                   T. Chol/HDL Ratio                                             Men  Women  1/2 Avg.Risk  3.4    3.3                                   Avg.Risk  5.0    4.4                                2X Avg.Risk  9.6    7.1                                 3X Avg.Risk 23.4   11.0  Estimated CHD Risk 0.9 0.6 CM 0.8 CM      Comment: The CHD Risk is based on the T. Chol/HDL ratio. Other factors affect CHD Risk such as hypertension, smoking, diabetes, severe obesity, and family history of premature CHD.  TSH 1.120 1.600 1.580      T4, Total 6.3 5.9 6.5      T3 Uptake Ratio 29 28 28       Free Thyroxine Index 1.8 1.7 1.8      Prostate Specific Ag, Serum 1.0 0.8 CM 0.9 CM      Comment: Roche ECLIA methodology. According to the American Urological Association, Serum PSA should decrease and remain at undetectable levels after radical prostatectomy. The AUA defines biochemical recurrence as an initial PSA value 0.2 ng/mL or greater followed by a subsequent confirmatory PSA value 0.2 ng/mL or greater. Values obtained with different assay methods or kits cannot be used interchangeably. Results cannot be interpreted as absolute evidence of the presence or absence of malignant disease.  WBC 7.4 6.4 5.2  8.0 R  6.8 R  RBC 5.71 5.40 5.14  5.28 R  5.15 R  Hemoglobin 16.8 16.0 15.4  15.3 R  15.4 R  Hematocrit 50.8 47.0 43.7  47.0 R  46.0 R  MCV 89 87 85  89.0 R  89.3 R  MCH 29.4 29.6 30.0  29.0 R  29.9 R  MCHC 33.1 34.0 35.2  32.6 R  33.5 R  RDW 13.2 13.3 13.0  13.2 R  12.9 R  Platelets 262 263 248  274 R  224 R  Neutrophils 51 55 54      Lymphs 33 31 31      Monocytes 11 11 11       Eos 4 1 4       Basos 1 1 0      Neutrophils Absolute 3.8 3.6 2.7      Lymphocytes Absolute 2.4 2.0 1.6      Monocytes Absolute 0.8 0.7 0.6      EOS (ABSOLUTE) 0.3 0.1 0.2      Basophils Absolute 0.1 0.0 0.0      Immature Granulocytes 0 1 0      Immature Grans (Abs) 0.0 0.0 0.0                   Ref Range & Units (hover) 7 d ago (12/01/23) 1 yr ago (11/27/22) 2 yr ago (09/10/21) 3 yr ago (12/02/20) 3 yr ago (08/20/20) 4 yr ago (07/11/19) 4 yr ago (04/11/19)  Hgb A1c MFr Bld 7.9 High  10.4 High  CM 6.9 High  CM R 10.0 High  CM 9.4 High  CM  9.0 Abnormal  R  Comment:  Prediabetes: 5.7 - 6.4          Diabetes: >6.4          Glycemic control for adults with diabetes: <7.0  HbA1c POC (<> result, manual entry)    7.8 R                          Component Ref Range & Units (hover) 7 d ago (12/01/23) 1 yr ago (11/27/22) 2 yr ago (09/10/21) 2 yr ago (09/09/21) 2 yr ago (05/28/21) 3 yr ago (09/03/20) 3 yr ago (08/23/20)  Color, UA YELLOW yellow yellow      Clarity, UA CLEAR clear clear      Glucose, UA Negative Positive Abnormal  CM Negative      Bilirubin, UA NEG neg negative      Ketones, UA NEG neg negative      Spec Grav, UA 1.025 1.025 >=1.030 Abnormal       Blood, UA NEG neg negative      pH, UA 5.5 6.0 5.5      Protein, UA Negative Negative Negative      Urobilinogen, UA 0.2 0.2 0.2      Nitrite, UA NEG neg negative      Leukocytes, UA Negative Negative Negative      Appearance    CLEAR Abnormal  R CLEAR R, CM HAZY Abnormal  R CLOUDY Abnormal  R  Odor                    _  Ref Range & Units (hover) 7 d ago 1 yr ago 2 yr ago 3 yr ago 4 yr ago  Creatinine, Urine 109.5 132.4 137.7 97.1 218.9  Microalbumin, Urine 4.0 11.0 4.4 4.9 67.0  Microalb/Creat Ratio 4 8 CM 3 CM 5 CM 31 High  CM  Comment:                        Normal:                0 -  29                        Moderately increased: 30 - 300                        Severely increased:       >300          _______________________________  EKG  Sinus rhythm at 93 bpm ____________________________________________    ____________________________________________   INITIAL IMPRESSION / ASSESSMENT AND PLAN  As part of my medical decision making, I reviewed the following data within the electronic MEDICAL RECORD NUMBER      No acute findings on physical exam or EKG.  Discussed lab results showing improved diabetic control.  Patient is amenable to increasing Ozempic  to 2 mg subcu weekly.  Patient was changed from Lipitor to Crestor.  Patient will follow-up  in 3 months with fasting labs.        ____________________________________________   FINAL CLINICAL IMPRESSION Annual physical exam  ED Discharge Orders     None        Note:  This document was prepared using Dragon voice recognition software and may include unintentional dictation errors.

## 2023-12-08 NOTE — Progress Notes (Signed)
 Pt presents today to complete physical, pt concerned with ozempic  dose level.

## 2024-01-22 ENCOUNTER — Other Ambulatory Visit: Payer: Self-pay | Admitting: Physician Assistant

## 2024-01-22 DIAGNOSIS — E119 Type 2 diabetes mellitus without complications: Secondary | ICD-10-CM

## 2024-02-17 ENCOUNTER — Encounter: Payer: Self-pay | Admitting: Physician Assistant

## 2024-02-17 ENCOUNTER — Ambulatory Visit: Payer: Self-pay | Admitting: Physician Assistant

## 2024-02-17 DIAGNOSIS — S39012A Strain of muscle, fascia and tendon of lower back, initial encounter: Secondary | ICD-10-CM

## 2024-02-17 MED ORDER — NAPROXEN 500 MG PO TABS: 500.0000 mg | ORAL_TABLET | Freq: Two times a day (BID) | ORAL | 0 refills | Status: AC

## 2024-02-17 MED ORDER — ORPHENADRINE CITRATE ER 100 MG PO TB12: 100.0000 mg | ORAL_TABLET | Freq: Two times a day (BID) | ORAL | 0 refills | Status: AC

## 2024-02-17 NOTE — Progress Notes (Signed)
 Stated pain in low back on both sides about 3 days and no injury and denies any injury and stated regular bowel and bladder habits and no other complaints.  HE is the only one working and can't leave to come to clinic by close of day.

## 2024-02-17 NOTE — Progress Notes (Signed)
   Subjective: Low back pain    Patient ID: Ricardo Nelson, male    DOB: Dec 02, 1965, 58 y.o.   MRN: [id number]  HPI Patient complaining of low back pain secondary to a twisting accident which occurred 3 days ago.  Patient states pain wax and wane.  Pain worse with ambulation.  Pain decreased with sitting down.  Denies bladder or bowel dysfunction.  Denies radicular component to back pain.  Patient cannot come in for evaluation t at this time since he is the sole worker at the plant.  Review of Systems Diabetes and gout    Objective:   Physical Exam Physical exam deferred secondary to Spanish telephonic encounter.       Assessment & Plan: Lumbar strain   Sent the prescription for Norflex  and naproxen .  Patient advised on conservative treatment consisting of putting ice to the area and rest.  Will follow-up on September 2 if no improvement or worsening complaint.

## 2024-02-29 DIAGNOSIS — Z1211 Encounter for screening for malignant neoplasm of colon: Secondary | ICD-10-CM | POA: Diagnosis not present

## 2024-03-06 LAB — COLOGUARD: COLOGUARD: NEGATIVE

## 2024-03-09 ENCOUNTER — Ambulatory Visit: Payer: Self-pay | Admitting: Physician Assistant

## 2024-03-09 ENCOUNTER — Encounter: Payer: Self-pay | Admitting: Physician Assistant

## 2024-03-09 DIAGNOSIS — E7800 Pure hypercholesterolemia, unspecified: Secondary | ICD-10-CM

## 2024-03-09 DIAGNOSIS — Z7689 Persons encountering health services in other specified circumstances: Secondary | ICD-10-CM

## 2024-03-09 DIAGNOSIS — E119 Type 2 diabetes mellitus without complications: Secondary | ICD-10-CM

## 2024-03-09 NOTE — Progress Notes (Signed)
   Subjective: Weight management    Patient ID: Ricardo Nelson, male    DOB: [birth year], 58 y.o.   MRN: [id number]  HPI Patient presents for reevaluation of weight management status post using Ozempic .  Patient continue metformin .  Patient  lost 11 pounds since last exam.  Patient admits to intermittent noncompliance once a week.  Patient has increased physical activities.  Patient's wife assisting him in diet.  States no side effects from medication.   Review of Systems Diabetes, gout, and hyperlipidemia.    Objective:   Physical Exam Deferred       Assessment & Plan: Weight management   Advised patient to continue previous medications.  Fasting labs taken today.  Will follow-up telephonically on 03/13/2021 with lab results and treatment options.

## 2024-03-09 NOTE — Progress Notes (Signed)
 Pt presents today for 3 month follow up and fasting labs.

## 2024-03-10 LAB — LIPID PANEL
Chol/HDL Ratio: 3.3 ratio (ref 0.0–5.0)
Cholesterol, Total: 133 mg/dL (ref 100–199)
HDL: 40 mg/dL (ref >39)
LDL Chol Calc (NIH): 72 mg/dL (ref 0–99)
Triglycerides: 118 mg/dL (ref 0–149)
VLDL Cholesterol Cal: 21 mg/dL (ref 5–40)

## 2024-03-10 LAB — HGB A1C W/O EAG: Hgb A1c MFr Bld: 8.5 % — ABNORMAL HIGH (ref 4.8–5.6)

## 2024-04-10 ENCOUNTER — Other Ambulatory Visit: Payer: Self-pay

## 2024-04-10 ENCOUNTER — Other Ambulatory Visit: Payer: Self-pay | Admitting: Physician Assistant

## 2024-04-10 DIAGNOSIS — E119 Type 2 diabetes mellitus without complications: Secondary | ICD-10-CM

## 2024-04-10 MED ORDER — SEMAGLUTIDE (2 MG/DOSE) 8 MG/3ML ~~LOC~~ SOPN: 2.0000 mg | PEN_INJECTOR | SUBCUTANEOUS | 3 refills | Status: DC

## 2024-04-10 MED ORDER — SEMAGLUTIDE (2 MG/DOSE) 8 MG/3ML ~~LOC~~ SOPN
2.0000 mg | PEN_INJECTOR | SUBCUTANEOUS | 3 refills | Status: DC
Start: 2024-04-10 — End: 2024-04-10

## 2024-07-13 ENCOUNTER — Other Ambulatory Visit: Payer: Self-pay | Admitting: Physician Assistant

## 2024-07-13 DIAGNOSIS — E119 Type 2 diabetes mellitus without complications: Secondary | ICD-10-CM
